# Patient Record
Sex: Male | Born: 1940 | Race: White | Hispanic: No | Marital: Married | Smoking: Never smoker
Health system: Southern US, Community
[De-identification: ages and names within clinical notes are randomized; demographics above are authoritative.]

## PROBLEM LIST (undated history)

## (undated) DIAGNOSIS — M069 Rheumatoid arthritis, unspecified: Secondary | ICD-10-CM

## (undated) DIAGNOSIS — E785 Hyperlipidemia, unspecified: Secondary | ICD-10-CM

## (undated) DIAGNOSIS — K922 Gastrointestinal hemorrhage, unspecified: Secondary | ICD-10-CM

## (undated) DIAGNOSIS — I10 Essential (primary) hypertension: Secondary | ICD-10-CM

## (undated) DIAGNOSIS — Z9289 Personal history of other medical treatment: Secondary | ICD-10-CM

## (undated) DIAGNOSIS — Z8739 Personal history of other diseases of the musculoskeletal system and connective tissue: Secondary | ICD-10-CM

## (undated) DIAGNOSIS — I2510 Atherosclerotic heart disease of native coronary artery without angina pectoris: Secondary | ICD-10-CM

## (undated) DIAGNOSIS — R1310 Dysphagia, unspecified: Secondary | ICD-10-CM

## (undated) DIAGNOSIS — K219 Gastro-esophageal reflux disease without esophagitis: Secondary | ICD-10-CM

## (undated) DIAGNOSIS — T4145XA Adverse effect of unspecified anesthetic, initial encounter: Secondary | ICD-10-CM

## (undated) DIAGNOSIS — I214 Non-ST elevation (NSTEMI) myocardial infarction: Secondary | ICD-10-CM

## (undated) DIAGNOSIS — T8859XA Other complications of anesthesia, initial encounter: Secondary | ICD-10-CM

## (undated) DIAGNOSIS — I480 Paroxysmal atrial fibrillation: Secondary | ICD-10-CM

## (undated) HISTORY — DX: Atherosclerotic heart disease of native coronary artery without angina pectoris: I25.10

## (undated) HISTORY — DX: Essential (primary) hypertension: I10

## (undated) HISTORY — PX: TONSILLECTOMY: SUR1361

## (undated) HISTORY — DX: Hyperlipidemia, unspecified: E78.5

---

## 2002-01-20 ENCOUNTER — Encounter: Admission: RE | Admit: 2002-01-20 | Discharge: 2002-01-20 | Payer: Self-pay | Admitting: Cardiology

## 2002-01-20 ENCOUNTER — Encounter: Payer: Self-pay | Admitting: Cardiology

## 2002-01-23 ENCOUNTER — Inpatient Hospital Stay (HOSPITAL_COMMUNITY): Admission: AD | Admit: 2002-01-23 | Discharge: 2002-01-28 | Payer: Self-pay | Admitting: Cardiology

## 2002-01-23 ENCOUNTER — Encounter: Payer: Self-pay | Admitting: Cardiothoracic Surgery

## 2002-01-24 ENCOUNTER — Encounter: Payer: Self-pay | Admitting: Cardiothoracic Surgery

## 2002-01-24 HISTORY — PX: CORONARY ARTERY BYPASS GRAFT: SHX141

## 2002-01-25 ENCOUNTER — Encounter: Payer: Self-pay | Admitting: Cardiothoracic Surgery

## 2002-01-26 ENCOUNTER — Encounter: Payer: Self-pay | Admitting: Cardiothoracic Surgery

## 2002-01-27 ENCOUNTER — Encounter: Payer: Self-pay | Admitting: Cardiothoracic Surgery

## 2002-02-24 ENCOUNTER — Encounter: Admission: RE | Admit: 2002-02-24 | Discharge: 2002-02-24 | Payer: Self-pay | Admitting: Cardiothoracic Surgery

## 2002-02-24 ENCOUNTER — Encounter: Payer: Self-pay | Admitting: Cardiothoracic Surgery

## 2010-06-26 DIAGNOSIS — Z9289 Personal history of other medical treatment: Secondary | ICD-10-CM

## 2010-06-26 HISTORY — DX: Personal history of other medical treatment: Z92.89

## 2010-07-26 DIAGNOSIS — K922 Gastrointestinal hemorrhage, unspecified: Secondary | ICD-10-CM

## 2010-07-26 HISTORY — DX: Gastrointestinal hemorrhage, unspecified: K92.2

## 2010-10-26 HISTORY — PX: CARDIAC CATHETERIZATION: SHX172

## 2011-06-27 DIAGNOSIS — I214 Non-ST elevation (NSTEMI) myocardial infarction: Secondary | ICD-10-CM

## 2011-06-27 HISTORY — DX: Non-ST elevation (NSTEMI) myocardial infarction: I21.4

## 2011-07-20 ENCOUNTER — Inpatient Hospital Stay (HOSPITAL_COMMUNITY)
Admission: EM | Admit: 2011-07-20 | Discharge: 2011-07-23 | DRG: 281 | Disposition: A | Discharge status: Home / Self Care | Payer: Medicare Other | Source: Ambulatory Visit | Attending: Cardiology | Admitting: Cardiology

## 2011-07-20 ENCOUNTER — Emergency Department (HOSPITAL_COMMUNITY): Payer: Medicare Other

## 2011-07-20 DIAGNOSIS — I25810 Atherosclerosis of coronary artery bypass graft(s) without angina pectoris: Secondary | ICD-10-CM | POA: Diagnosis present

## 2011-07-20 DIAGNOSIS — K279 Peptic ulcer, site unspecified, unspecified as acute or chronic, without hemorrhage or perforation: Secondary | ICD-10-CM | POA: Diagnosis present

## 2011-07-20 DIAGNOSIS — Z7982 Long term (current) use of aspirin: Secondary | ICD-10-CM

## 2011-07-20 DIAGNOSIS — I452 Bifascicular block: Secondary | ICD-10-CM | POA: Diagnosis present

## 2011-07-20 DIAGNOSIS — I214 Non-ST elevation (NSTEMI) myocardial infarction: Principal | ICD-10-CM | POA: Diagnosis present

## 2011-07-20 DIAGNOSIS — I2582 Chronic total occlusion of coronary artery: Secondary | ICD-10-CM | POA: Diagnosis present

## 2011-07-20 DIAGNOSIS — I498 Other specified cardiac arrhythmias: Secondary | ICD-10-CM | POA: Diagnosis present

## 2011-07-20 DIAGNOSIS — I2510 Atherosclerotic heart disease of native coronary artery without angina pectoris: Secondary | ICD-10-CM | POA: Diagnosis present

## 2011-07-20 DIAGNOSIS — E785 Hyperlipidemia, unspecified: Secondary | ICD-10-CM | POA: Diagnosis present

## 2011-07-20 LAB — APTT: aPTT: 22 seconds — ABNORMAL LOW (ref 24–37)

## 2011-07-20 LAB — DIFFERENTIAL
Eosinophils Relative: 1 % (ref 0–5)
Lymphocytes Relative: 10 % — ABNORMAL LOW (ref 12–46)
Lymphs Abs: 1.3 10*3/uL (ref 0.7–4.0)
Monocytes Relative: 8 % (ref 3–12)
Neutrophils Relative %: 81 % — ABNORMAL HIGH (ref 43–77)

## 2011-07-20 LAB — COMPREHENSIVE METABOLIC PANEL
ALT: 25 U/L (ref 0–53)
Albumin: 3.9 g/dL (ref 3.5–5.2)
Alkaline Phosphatase: 72 U/L (ref 39–117)
BUN: 15 mg/dL (ref 6–23)
Potassium: 4.4 mEq/L (ref 3.5–5.1)
Sodium: 143 mEq/L (ref 135–145)
Total Protein: 6.9 g/dL (ref 6.0–8.3)

## 2011-07-20 LAB — CBC
HCT: 45.3 % (ref 39.0–52.0)
MCV: 84.8 fL (ref 78.0–100.0)
RBC: 5.34 MIL/uL (ref 4.22–5.81)
WBC: 13.1 10*3/uL — ABNORMAL HIGH (ref 4.0–10.5)

## 2011-07-20 LAB — POCT I-STAT, CHEM 8
BUN: 17 mg/dL (ref 6–23)
Chloride: 105 mEq/L (ref 96–112)
Sodium: 144 mEq/L (ref 135–145)

## 2011-07-20 LAB — POCT I-STAT TROPONIN I: Troponin i, poc: 0.14 ng/mL (ref 0.00–0.08)

## 2011-07-20 LAB — CK TOTAL AND CKMB (NOT AT ARMC)
CK, MB: 4.7 ng/mL — ABNORMAL HIGH (ref 0.3–4.0)
Relative Index: 3.6 — ABNORMAL HIGH (ref 0.0–2.5)

## 2011-07-20 LAB — HEMOGLOBIN A1C: Hgb A1c MFr Bld: 5.6 % (ref <5.7)

## 2011-07-20 LAB — MAGNESIUM: Magnesium: 2.2 mg/dL (ref 1.5–2.5)

## 2011-07-21 LAB — URINALYSIS, ROUTINE W REFLEX MICROSCOPIC
Bilirubin Urine: NEGATIVE
Glucose, UA: NEGATIVE mg/dL
Hgb urine dipstick: NEGATIVE
Ketones, ur: NEGATIVE mg/dL
pH: 6.5 (ref 5.0–8.0)

## 2011-07-21 LAB — LIPID PANEL
Cholesterol: 135 mg/dL (ref 0–200)
HDL: 39 mg/dL — ABNORMAL LOW (ref >39)
LDL Cholesterol: 79 mg/dL (ref 0–99)
Triglycerides: 85 mg/dL (ref <150)

## 2011-07-21 LAB — CK TOTAL AND CKMB (NOT AT ARMC)
Relative Index: 6.9 — ABNORMAL HIGH (ref 0.0–2.5)
Relative Index: 8.1 — ABNORMAL HIGH (ref 0.0–2.5)

## 2011-07-21 LAB — TROPONIN I
Troponin I: 0.64 ng/mL (ref <0.30)
Troponin I: 2.04 ng/mL (ref <0.30)

## 2011-07-21 LAB — CBC
MCH: 28.9 pg (ref 26.0–34.0)
MCV: 84.8 fL (ref 78.0–100.0)
Platelets: 181 10*3/uL (ref 150–400)
RBC: 4.99 MIL/uL (ref 4.22–5.81)

## 2011-07-21 LAB — POCT ACTIVATED CLOTTING TIME: Activated Clotting Time: 105 seconds

## 2011-07-21 LAB — CARDIAC PANEL(CRET KIN+CKTOT+MB+TROPI)
CK, MB: 16.1 ng/mL (ref 0.3–4.0)
Total CK: 244 U/L — ABNORMAL HIGH (ref 7–232)

## 2011-07-22 LAB — CBC
MCH: 28.5 pg (ref 26.0–34.0)
MCV: 84.8 fL (ref 78.0–100.0)
Platelets: 177 10*3/uL (ref 150–400)
RDW: 13.9 % (ref 11.5–15.5)

## 2011-07-22 LAB — BASIC METABOLIC PANEL
BUN: 15 mg/dL (ref 6–23)
CO2: 26 mEq/L (ref 19–32)
Chloride: 107 mEq/L (ref 96–112)
Creatinine, Ser: 0.93 mg/dL (ref 0.50–1.35)
GFR calc Af Amer: >60 mL/min (ref >60)
Potassium: 3.9 mEq/L (ref 3.5–5.1)

## 2011-07-28 NOTE — Discharge Summary (Signed)
Earl Keller, Earl Keller                 ACCOUNT NO.:  [id number]  MEDICAL RECORD NO.:  [id number]  LOCATION:  3712                         FACILITY:  [facility]  PHYSICIAN:  Thereasa Solo. Little, M.D. DATE OF BIRTH:  [birth year]  DATE OF ADMISSION:  07/20/2011 DATE OF DISCHARGE:  07/23/2011                              DISCHARGE SUMMARY   DISCHARGE DIAGNOSES: 1. Non-ST-elevation myocardial infarction with a peak CK of 244 and 16     MB and a troponin of 2.35. 2. Known coronary disease with coronary artery bypass grafting x5 in     2003, catheterization this admission, plan is for medical therapy. 3. Mild left ventricular dysfunction with an EF of 40%-45%     catheterization. 4. Treated dyslipidemia with an LDL of 79. 5. History of gastrointestinal bleeding, October 2011, his aspirin was     resumed this admission and he is tolerating this, but will need to     continue on a PPI.  HOSPITAL COURSE:  The patient is a 70 year old male who had bypass grafting x5 in 2003 by Dr. Tyrone Sage.  He had done well since then.  On the day of admission, he was playing golf with his wife when he had chest pain and diaphoresis.  He admitted to her that he had actually had a couple episodes of chest pain similar to that in the last week.  He presented to [facility] where his EKG showed ST elevation in V3 and he was treated as a STEMI.  He was transferred to [facility] and evaluated in the emergency room by Dr. Tresa Endo and myself.  We did not feel he was an ST-elevation MI.  He was admitted with unstable angina.  His enzymes were obtained and were positive for a non-ST-elevation MI.  He was cathed on July 21, 2011 by Dr. Tresa Endo, he had a patent LIMA to the LAD, patent sequential vein graft to the ramus intermedius and the OM, but there was an 80% distal OM narrowing, the SVG to RCA was patent with a 50%-60% narrowing at the insertion and 80% small PDA lesion after the graft insertion.  EF was 40%-45%, renal  arteries and iliacs were normal. Plan is for continued medical therapy.  He has been pain-free, ambulated.  We feel he can be discharged on July 23, 2011.  He will follow up with Dr. Clarene Duke.  Please see med rec for complete discharge medications.  LABORATORY DATA:  As noted.  CK is peaked at 244 with 16 MB and troponin of 2.35.  Urinalysis is unremarkable.  Cholesterol 135, HDL 39, LDL 79. Hemoglobin A1c is 5.6.  Liver functions were normal.  Sodium 141, potassium 3.9, BUN 15, creatinine 0.9.  White count 9.5, hemoglobin 13.7, hematocrit 40.8, platelets 177.  His EKG shows sinus rhythm, right bundle-branch block, and left anterior fascicular block.  DISPOSITION:  The patient is discharged in stable condition.  We will decrease his beta-blocker at discharge because of his bifascicular block and bradycardia.     Abelino Derrick, P.A.   ______________________________ Thereasa Solo. Little, M.D.    Earl Keller  D:  07/23/2011  T:  07/23/2011  Job:  [id number]  Electronically  Signed by Corine Shelter P.A. on 07/25/2011 06:07:42 PM Electronically Signed by Julieanne Manson M.D. on 07/28/2011 11:29:28 AM

## 2011-07-28 NOTE — H&P (Signed)
Earl Keller, Earl Keller                 ACCOUNT NO.:  [id number]  MEDICAL RECORD NO.:  [id number]  LOCATION:  3712                         FACILITY:  [facility]  PHYSICIAN:  Thereasa Solo. Little, M.D. DATE OF BIRTH:  03/18/1941  DATE OF ADMISSION:  07/20/2011 DATE OF DISCHARGE:                             HISTORY & PHYSICAL   CHIEF COMPLAINTS:  Chest pain.  HISTORY OF PRESENT ILLNESS:  Mr. Kyler is a 70 year old male followed by Dr. Clarene Duke with a history of coronary artery bypass grafting in 2003. This was by Dr. Tyrone Sage.  He had an LIMA to the LAD, an SVG to the diagonal, an SVG to the ramus intermedius, and circumflex sequentially and an SVG to the PDA.  We last saw him in 2008.  He has done well, he is a nonsmoker and daily exerciser.  In October 2011, he did have some GI bleeding and his aspirin was stopped.  Today, while playing golf with his wife, he noted substernal chest pain which was associated with diaphoresis.  He admitted that he had had previous episodes in the past 2 weeks of similar symptoms.  He has not taken nitroglycerin.  He presented to [facility] and was felt to have ST elevation in lead V3 and was treated as an ST elevation MI.  He was transferred urgently to [facility] where he was evaluated in the emergency room by Dr. Tresa Endo and myself.  He is currently pain-free.  His EKG does not show any ST elevation now.  PAST MEDICAL HISTORY:  Remarkable for peptic ulcer disease, he is taken off aspirin on October 2011, after GI bleed.  He has treated dyslipidemia.  HOME MEDICATIONS:  Omeprazole 40 mg a day and Lipitor 40 mg a day.  ALLERGIES:  He has no known drug allergies.  SOCIAL HISTORY:  He is a retired Engineer, site.  He is a nonsmoker.  He is married.  He has 3 children.  FAMILY HISTORY:  Remarkable for coronary artery disease.  REVIEW OF SYSTEMS:  He has had no recent melena or GI bleeding.  PHYSICAL EXAMINATION:  VITAL SIGNS:  Blood pressure 118/70, pulse  60, temperature is afebrile, respirations 12. GENERAL:  He is a well-developed, well-nourished male in no acute distress. HEENT:  Normocephalic.  Extraocular movements are intact.  Sclera is nonicteric.  Lids and conjunctivae within normal limits. NECK:  Without JVD or bruit. CHEST:  Clear to auscultation and percussion. CARDIAC:  Regular rate and rhythm without murmur, rub or gallop.  Normal S1 and S2. ABDOMEN:  Nontender, nondistended. EXTREMITIES:  No edema.  Distal pulses are 3+/4.  There are no bruits. NEURO:  Grossly intact.  He is awake, alert, oriented, and cooperative. Moves all extremities without obvious deficit.  EKG shows sinus rhythm with an incomplete right bundle branch block. Labs are pending.  IMPRESSION: 1. Unstable angina, rule out myocardial infarction. 2. Coronary artery disease with coronary artery bypass grafting x5 in     2003. 3. Peptic ulcer disease October 2011, aspirin was discontinued at that     time. 4. Treated dyslipidemia.  PLAN:  The patient was seen by Dr. Tresa Endo and myself in the emergency room.  He will be started on IV heparin, nitroglycerin and admitted to the intensive care unit.  We will add beta-blocker and PPI and resume his aspirin.     Abelino Derrick, P.A.   ______________________________ Thereasa Solo. Little, M.D.    Lenard Lance  D:  07/23/2011  T:  07/23/2011  Job:  [id number]  cc:   Desmond Dike, MD  Electronically Signed by Corine Shelter P.A. on 07/25/2011 06:07:38 PM Electronically Signed by Julieanne Manson M.D. on 07/28/2011 11:29:10 AM

## 2011-07-29 NOTE — Cardiovascular Report (Signed)
NAMEDANNE, VASEK NO.:  [id number]  MEDICAL RECORD NO.:  [id number]  LOCATION:                                 FACILITY:  PHYSICIAN:  Nicki Guadalajara, M.D.     DATE OF BIRTH:  Mar 23, 1941  DATE OF PROCEDURE: DATE OF DISCHARGE:                           CARDIAC CATHETERIZATION   PROCEDURE:  Left heart catheterization:  Cine coronary angiography; selective angiography into vein grafts, selective angiography into left internal mammary artery; biplane cine left ventriculography; distal aortography.  INDICATIONS:  [name]. Earl Keller is a 70 year old patient of Dr. Julieanne Manson who underwent CABG revascularization surgery x5 in 2003 by Dr. Tyrone Sage and had a LIMA placed to the LAD, a vein to a diagonal, sequential vein to the ramus intermediate and circumflex vessel, and a vein to the PDA.  The patient apparently has a history of peptic ulcer disease with GI bleed in October 2011 and at that time his aspirin was discontinued.  He has a history of dyslipidemia.  For the past 1-2 weeks, he has noticed some recurrent episodes of chest discomfort. Yesterday, while playing golf, he developed more chest pain.  He ultimately presented to [facility] ER.  His initial ECG showed T-wave inversion in V2 and possible 1-mm J-point elevation in lead III.  A code STEMI was called from Sage.  The patient was treated with heparin and nitroglycerin.  Upon arrival to [facility] ER, he was completely pain free with resolution of ECG changes.  The code STEMI was called off.  He was admitted to the Coronary Care Unit where he remained pain free.  CK was 131 with an MB of 4.7, repeat 157 with an MB of 10.8.  Initial troponin was less than 0.30, repeat 0.64.  He now presents for cardiac catheterization.  PROCEDURE:  After premedication with Versed 2 mg plus fentanyl 25 mcg, the patient was prepped and draped in the usual fashion.  His right femoral artery was punctured anteriorly and a  5-French sheath was inserted without difficulty.  Diagnostic cardiac catheterization was done utilizing 5-French Judkins #4 left and right coronary catheters. The right catheter was also used for selective angiography into the saphenous vein grafts.  A left internal mammary artery catheter was used for selective angiography into the left internal mammary artery.  A 5- French pigtail catheter was then inserted and utilized for biplane cine left ventriculography as well as distal aortography.  Hemostasis was obtained by direct manual pressure.  The patient tolerated the procedure well.  HEMODYNAMIC DATA:  Central aortic pressure was 120/60.  Left ventricular pressure 120/14.  ANGIOGRAPHIC DATA:  Left main coronary artery was angiographically normal and bifurcated into an LAD and left circumflex system.  The LAD gave rise to a proximal diagonal vessel that appeared free of significant disease.  There was segmental 80% stenosis in the LAD after this first diagonal vessel proximal to a septal perforating artery and then the LAD had a flush and fill phenomena due to competitive LIMA filling.  The circumflex coronary artery was essentially subtotally occluded at its origin with greater than 99% stenosis with insignificant filling.  The right coronary artery had proximal 95%  stenosis and then was totally occluded in its mid portion after a small anterior RV marginal branch.  The sequential vein graft which supplied the ramus intermediate vessel and distal circumflex marginal vessel had smooth 30% taper ostially and proximally.  The vessel supplied the ramus intermediate vessel and then the sequential limb supplied the distal circumflex marginal vessel.  In the mid distal portion of this marginal vessel, there was diffuse narrowing of at least 80% and a very small-caliber vessel.  The vein graft which supplied the diagonal vessel was totally occluded at its origin.  Next, a saphenous vein  graft supplying the PDA was large- caliber graft.  Right at the anastomosis, there was smooth 50-60% narrowing extending into a branch of this PDA.  In the very distal PDA limb, there was diffuse 80% stenosis.  Antegrade to the graft prior to the takeoff of the PLA vessel, there was 95% proximal PDA stenosis which also jeopardized the PLA segment.  There was segmental 95% stenosis in the RCA antegrade diffusely in the region of the acute margin extending to the total occluded mid segment.  Next, the left internal mammary artery had ostial smooth taper of approximately 50% and anastomosed into the mid LAD and otherwise was free of significant disease with the exception of the smooth ostial taper.  The LAD extended to the LV apex.  Biplane cine left ventriculography revealed an ejection fraction of approximately 40-45%.  On the LAO projection, there was a moderate focal hypocontractility in the mid distal anterolateral wall with a more severe hypo to akinesis very focally in the mid anterolateral segment. On the LAO projection, there was mild distal inferolateral hypocontractility.  Distal aortography revealed widely patent renal arteries without significant aortoiliac disease.  IMPRESSION: 1. Mild moderate left ventricular dysfunction with an ejection     fraction of 40-45% with moderate focal hypocontractility in the mid     distal anterolateral wall and very focal severe hypocontractility     in the mid anterolateral segment as well as mild hypocontractility     in the distal inferolateral segment. 2. Significant native coronary artery disease with segmental proximal     80% stenosis in the left anterior descending after the first     diagonal vessel before the septal perforating artery. 3. Supple total occlusion of the circumflex vessel ostially. 4. 95% very proximal and 100% occlusion of the mid right coronary     artery. 5. Patent left internal mammary artery graft supplying  the mid left     anterior descending, but evidence for smooth 50% ostial narrowing     arising from the subclavian artery. 6. Sequential vein graft supplying the ramus intermediate vessel and     distal circumflex marginal vessel with smooth 30% ostial proximal     narrowing in the graft and evidence for distal obtuse marginal     diffuse narrowing of 80% in the small caliber portion of the     vessel. 7. Large vein graft supplying the posterior descending artery of the     right coronary artery with smooth 50-60% anastomosis narrowing and     very distal 80% stenosis in the posterior descending artery with     antegrade proximal 95% posterior descending artery stenosis     jeopardizing posterolateral artery vessel.  RECOMMENDATIONS:  [name] will be initially treated with increased medical therapy.  Consideration for Ranexa will be added to his regimen. Subsequent nuclear imaging may be helpful to ascertain areas of ischemia  on increased medical therapy.          ______________________________ Nicki Guadalajara, M.D.     TK/MEDQ  D:  07/21/2011  T:  07/21/2011  Job:  [id number]  cc:   Thereasa Solo. Little, M.D. Desmond Dike, MD  Electronically Signed by Nicki Guadalajara M.D. on 07/29/2011 01:05:41 PM

## 2012-11-28 IMAGING — CR DG CHEST 1V PORT
1 series · 1 of 1 positions shown · non-contrast
Comparison: None.

CLINICAL DATA: Severe left-sided chest pain

PORTABLE CHEST - 1 VIEW

[AP]
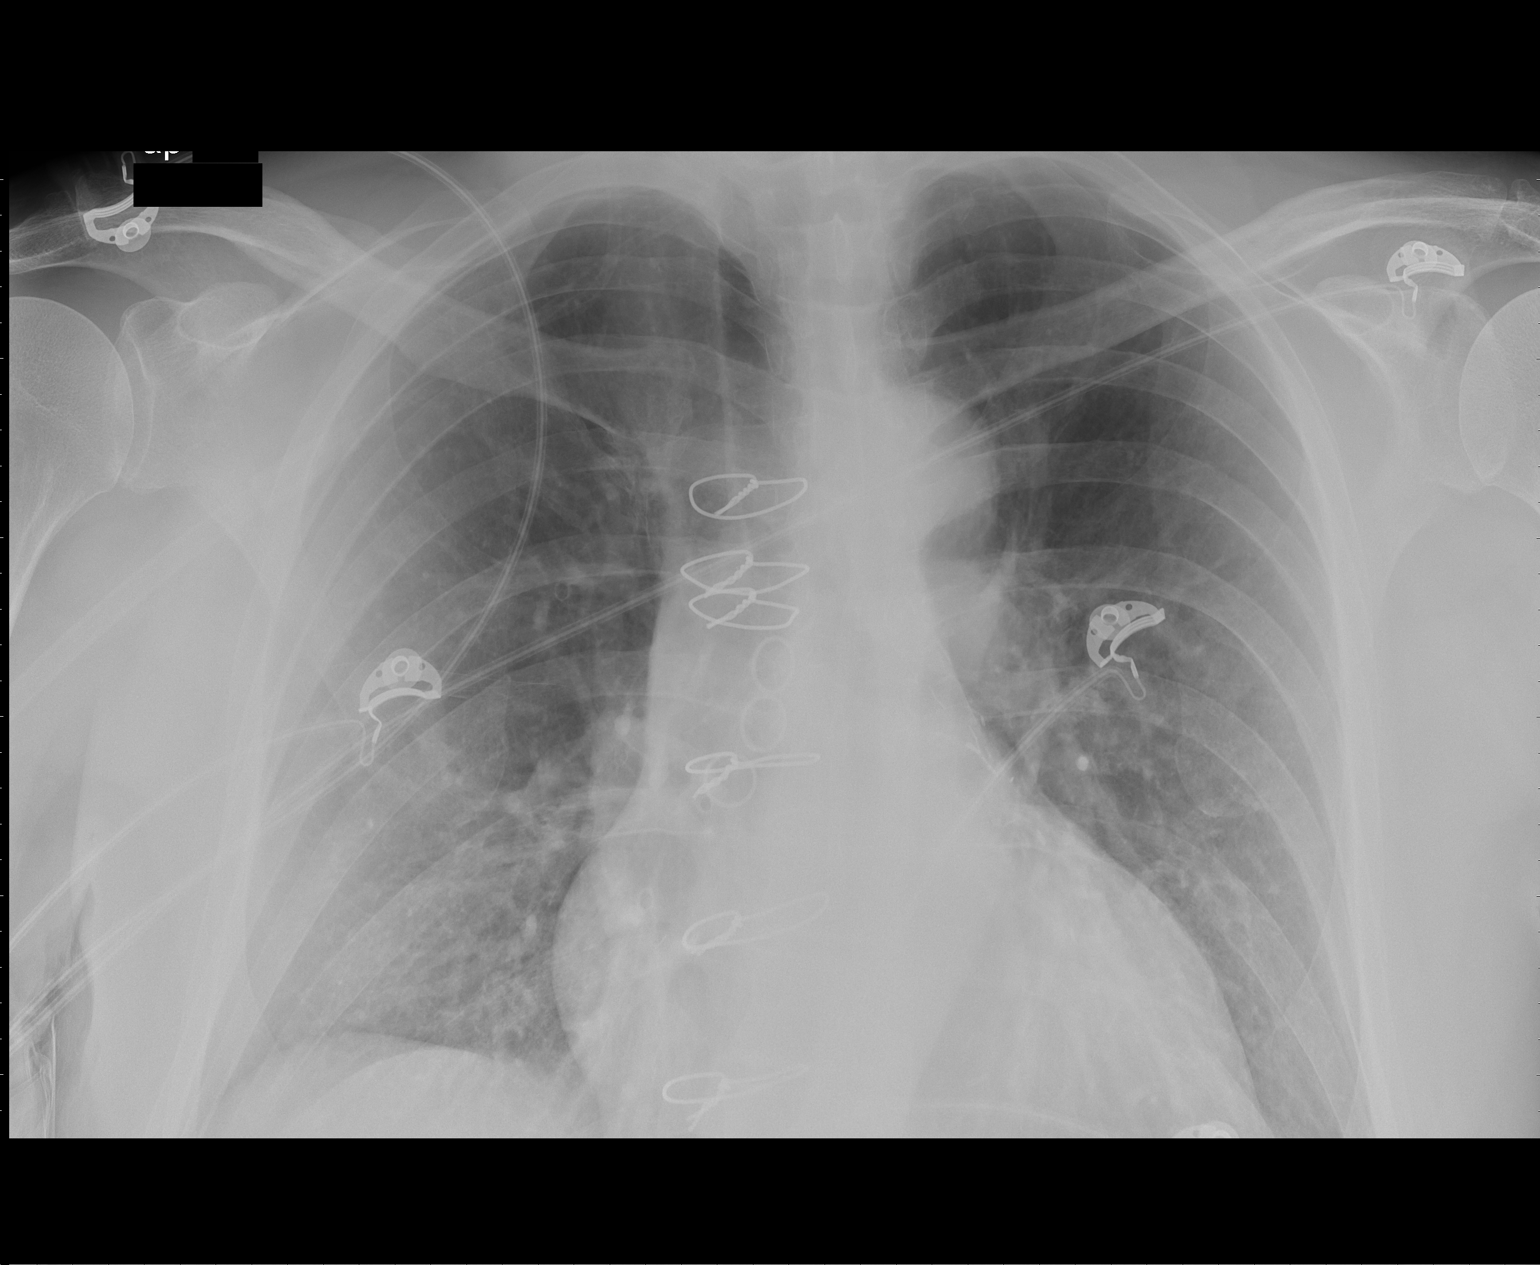

[1 of 1 positions shown; findings below may reference images not displayed]

FINDINGS: Chronic interstitial markings in the bilateral lower
lobes.  No focal consolidation. No pleural effusion or
pneumothorax.

Mild cardiomegaly. Postsurgical changes related to prior CABG.
IMPRESSION: No evidence of acute cardiopulmonary disease.

Mild cardiomegaly.

## 2013-05-31 ENCOUNTER — Other Ambulatory Visit: Payer: Self-pay | Admitting: Cardiovascular Disease

## 2013-05-31 NOTE — Telephone Encounter (Signed)
Rx was sent to pharmacy electronically. 

## 2013-06-06 ENCOUNTER — Telehealth: Payer: Self-pay

## 2013-06-07 ENCOUNTER — Encounter: Payer: Self-pay | Admitting: Internal Medicine

## 2013-06-07 ENCOUNTER — Encounter: Payer: Self-pay | Admitting: *Deleted

## 2013-06-08 ENCOUNTER — Ambulatory Visit (INDEPENDENT_AMBULATORY_CARE_PROVIDER_SITE_OTHER): Payer: Medicare PPO | Admitting: Physician Assistant

## 2013-06-08 ENCOUNTER — Encounter: Payer: Self-pay | Admitting: Physician Assistant

## 2013-06-08 VITALS — BP 128/62 | HR 58 | Ht 74.0 in | Wt 209.5 lb

## 2013-06-08 DIAGNOSIS — I255 Ischemic cardiomyopathy: Secondary | ICD-10-CM

## 2013-06-08 DIAGNOSIS — I2589 Other forms of chronic ischemic heart disease: Secondary | ICD-10-CM

## 2013-06-08 DIAGNOSIS — I2510 Atherosclerotic heart disease of native coronary artery without angina pectoris: Secondary | ICD-10-CM

## 2013-06-08 DIAGNOSIS — Z951 Presence of aortocoronary bypass graft: Secondary | ICD-10-CM

## 2013-06-08 NOTE — Assessment & Plan Note (Signed)
Patient is a very active male currently doing well with no complaints. We did discuss stopping the Ranexa as he is only taking one dose daily. This was a discussion that Dr. Rennis Golden had with him previously as well.  If he develops angina off of the medication, he will restart it.  As active as he appears to be, I do not think this will occur. He will followup with Dr. Rennis Golden 6 months

## 2013-06-08 NOTE — Progress Notes (Signed)
Date:  06/08/2013   ID:  Earl Keller, DOB June 14, 1941, MRN [id number]  PCP:  Pcp Not In System  Primary Cardiologist:  HIlty     History of Present Illness: Earl Keller is a 72 y.o. male with a history of coronary artery bypass grafting in 2003. In 2012 and non-ST elevation myocardial infarction and catheterization at that time showed patent grafts with small vessel disease and distal disease of native vessels. The ejection fraction that time was 45%. The patient has been doing well on medical therapy with no complaints of angina. He has been taking Ranexa once Earl since about February.  He states he is quite active does some hiking and biking fairly regularly without any concerns.  He currently denies nausea, vomiting, fever, chest pain, shortness of breath, orthopnea, dizziness, PND, cough, congestion, abdominal pain, hematochezia, melena, lower extremity edema, claudication.  Wt Readings from Last 3 Encounters:  06/08/13 209 lb 8 oz (95.029 kg)     Past Medical History  Diagnosis Date  . CAD (coronary artery disease)     post bypass in 2003  . Dyslipidemia   . Hypertension     Current Outpatient Prescriptions  Medication Sig Dispense Refill  . aspirin 81 MG tablet Take 81 mg by mouth Earl.      Marland Kitchen atorvastatin (LIPITOR) 40 MG tablet Take 40 mg by mouth Earl.      . isosorbide mononitrate (IMDUR) 60 MG 24 hr tablet Take 60 mg by mouth Earl.      . metoprolol tartrate (LOPRESSOR) 25 MG tablet TAKE ONE-HALF TABLET BY MOUTH TWICE Earl  30 tablet  6  . nitroGLYCERIN (NITROSTAT) 0.4 MG SL tablet Place 0.4 mg under the tongue every 5 (five) minutes as needed for chest pain.      Marland Kitchen omeprazole (PRILOSEC) 20 MG capsule Take 20 mg by mouth Earl.      . ranolazine (RANEXA) 500 MG 12 hr tablet Take 500 mg by mouth Earl.        No current facility-administered medications for this visit.    Allergies:   No Known Allergies  Social History:  The patient  reports that he has  never smoked. He has never used smokeless tobacco. He reports that he does not drink alcohol or use illicit drugs.   Family history:   Family History  Problem Relation Age of Onset  . Sudden death Mother 52  . Liver disease Maternal Grandmother   . Anuerysm Maternal Grandfather   . Stroke Paternal Grandmother   . Heart attack Paternal Grandfather     ROS:  Please see the history of present illness.  All other systems reviewed and negative.   PHYSICAL EXAM: VS:  BP 128/62  Pulse 58  Ht 6\' 2"  (1.88 m)  Wt 209 lb 8 oz (95.029 kg)  BMI 26.89 kg/m2 Well nourished, well developed, in no acute distress HEENT: Pupils are equal round react to light accommodation extraocular movements are intact.  Neck: no JVDNo cervical lymphadenopathy. Cardiac: Regular rate and rhythm without murmurs rubs or gallops. Lungs:  clear to auscultation bilaterally, no wheezing, rhonchi or rales Abd: soft, nontender, positive bowel sounds all quadrants, no hepatosplenomegaly Ext: no lower extremity edema.  2+ radial and dorsalis pedis pulses. Skin: warm and dry Neuro:  Grossly normal  EKG:  Sinus bradycardia(58) first degree AV block right bundle branch block    ASSESSMENT AND PLAN:  Problem List Items Addressed This Visit   Status post coronary  artery bypass grafting, 2003.     Coronary artery disease     Patient is a very active male currently doing well with no complaints. We did discuss stopping the Ranexa as he is only taking one dose Earl. This was a discussion that Dr. Rennis Golden had with him previously as well.  If he develops angina off of the medication, he will restart it.  As active as he appears to be, I do not think this will occur. He will followup with Dr. Rennis Golden 6 months    Cardiomyopathy, ischemic.  Ejection fraction 45% by LV gram during cath 07/21/2011    Other Visit Diagnoses   CAD (coronary artery disease)    -  Primary    Relevant Orders       EKG 12-Lead

## 2013-06-08 NOTE — Patient Instructions (Addendum)
Followup with Dr. Hilty in 6 months 

## 2013-07-04 ENCOUNTER — Other Ambulatory Visit: Payer: Self-pay | Admitting: Cardiovascular Disease

## 2013-07-04 NOTE — Telephone Encounter (Signed)
Rx was sent to pharmacy electronically. 

## 2013-12-12 ENCOUNTER — Ambulatory Visit: Payer: Medicare PPO | Admitting: Internal Medicine

## 2014-01-11 ENCOUNTER — Ambulatory Visit (INDEPENDENT_AMBULATORY_CARE_PROVIDER_SITE_OTHER): Payer: Medicare PPO | Admitting: Internal Medicine

## 2014-01-11 ENCOUNTER — Encounter: Payer: Self-pay | Admitting: Internal Medicine

## 2014-01-11 VITALS — BP 112/68 | HR 53 | Ht 74.0 in | Wt 209.6 lb

## 2014-01-11 DIAGNOSIS — I255 Ischemic cardiomyopathy: Secondary | ICD-10-CM

## 2014-01-11 DIAGNOSIS — K219 Gastro-esophageal reflux disease without esophagitis: Secondary | ICD-10-CM

## 2014-01-11 DIAGNOSIS — E785 Hyperlipidemia, unspecified: Secondary | ICD-10-CM | POA: Insufficient documentation

## 2014-01-11 DIAGNOSIS — I2589 Other forms of chronic ischemic heart disease: Secondary | ICD-10-CM

## 2014-01-11 DIAGNOSIS — Z951 Presence of aortocoronary bypass graft: Secondary | ICD-10-CM

## 2014-01-11 DIAGNOSIS — I2510 Atherosclerotic heart disease of native coronary artery without angina pectoris: Secondary | ICD-10-CM

## 2014-01-11 NOTE — Patient Instructions (Signed)
Your physician recommends that you return for lab work at your earliest convenience. You will need to be fasting (nothing to eat/drink after midnight)  Your physician wants you to follow-up in: 1 year. You will receive a reminder letter in the mail two months in advance. If you don't receive a letter, please call our office to schedule the follow-up appointment.

## 2014-01-11 NOTE — Progress Notes (Signed)
OFFICE NOTE  Chief Complaint:  No complaints  Primary Care Physician: Desmond Dike, MD  HPI:  Earl Keller is a 73 y.o. male with a history of coronary artery bypass grafting in 2003. In 2012 and non-ST elevation myocardial infarction and catheterization at that time showed patent grafts with small vessel disease and distal disease of native vessels. The ejection fraction that time was 45%. The patient has been doing well on medical therapy with no complaints of angina. He states he is quite active does some hiking and biking fairly regularly without any concerns. He currently denies nausea, vomiting, fever, chest pain, shortness of breath, orthopnea, dizziness, PND, cough, congestion, abdominal pain, hematochezia, melena, lower extremity edema, claudication.  He was previously on Ranexa and isosorbide.   PMHx:  Past Medical History  Diagnosis Date  . CAD (coronary artery disease)     post bypass in 2003  . Dyslipidemia   . Hypertension     Past Surgical History  Procedure Laterality Date  . Coronary artery bypass graft  01/2002    EF 45%  . Cardiac catheterization  2012    showed patent grafts with small- vessel disease and distal disease of the native vessels. 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 Sudden death Mother 42  . Liver disease Maternal Grandmother   . Anuerysm Maternal Grandfather   . Stroke Paternal Grandmother   . Heart attack Paternal Grandfather     SOCHx:   reports that he has never smoked. He has never used smokeless tobacco. He reports that he does not drink alcohol or use illicit drugs.  ALLERGIES:  No Known Allergies  ROS: A comprehensive review of systems was negative.  HOME  MEDS: Current Outpatient Prescriptions  Medication Sig Dispense Refill  . aspirin 81 MG tablet Take 81 mg by mouth daily.      Marland Kitchen atorvastatin (LIPITOR) 40 MG tablet Take 40 mg by mouth daily.      . metoprolol tartrate (LOPRESSOR) 25 MG tablet TAKE ONE-HALF TABLET BY MOUTH TWICE DAILY  30 tablet  6  . nitroGLYCERIN (NITROSTAT) 0.4 MG SL tablet Place 0.4 mg under the tongue every 5 (five) minutes as needed for chest pain.      Marland Kitchen omeprazole (PRILOSEC) 20 MG capsule Take 20 mg by mouth daily.       No current facility-administered medications for this visit.    LABS/IMAGING: No results found for this or any previous visit (from the past 48 hour(s)). No results found.  VITALS: BP 112/68  Pulse 53  Ht 6\' 2"  (1.88 m)  Wt 209 lb 9.6 oz (95.074 kg)  BMI 26.90 kg/m2  EXAM: General appearance: alert and no distress Neck: no carotid bruit and no JVD Lungs: clear to auscultation bilaterally Heart: regular rate and rhythm, S1, S2 normal, no murmur, click, rub or gallop Abdomen: soft, non-tender; bowel sounds normal; no masses,  no organomegaly Extremities: extremities normal, atraumatic, no cyanosis or edema Pulses: 2+ and symmetric Skin: Skin color, texture, turgor normal. No rashes or lesions Neurologic: Grossly normal Psych: Mood, affect normal  EKG: Sinus bradycardia at 53, bifascicular block, 1st degree AVB  ASSESSMENT: 1. CAD s/p CABG x  5 in 2003 2. Dyslipidemia 3. Ischemic cardioymyopathy, EF 45% 4. HTN  PLAN: 1.   Earl Keller is doing well and is asymptomatic. He denies any chest pain despite coming off of Imdur were and Ranexa. His current myopathy appears stable without any worsening symptoms or signs of congestion. His blood pressure is well-controlled. He is due for recheck of his cholesterol and I provided him with a lab slip for that today. Plan to see him back annually or sooner as necessary.  Chrystie Nose, MD, [facility] Attending Cardiologist [facility]  Gee Habig C 01/11/2014, 11:21 AM

## 2014-03-15 ENCOUNTER — Other Ambulatory Visit: Payer: Self-pay

## 2014-03-15 MED ORDER — METOPROLOL TARTRATE 25 MG PO TABS: 12.5000 mg | ORAL_TABLET | Freq: Two times a day (BID) | ORAL | Status: DC

## 2014-03-15 NOTE — Telephone Encounter (Signed)
Rx was sent to pharmacy electronically. 

## 2014-11-26 DIAGNOSIS — Z8739 Personal history of other diseases of the musculoskeletal system and connective tissue: Secondary | ICD-10-CM

## 2014-11-26 HISTORY — DX: Personal history of other diseases of the musculoskeletal system and connective tissue: Z87.39

## 2014-11-27 ENCOUNTER — Telehealth: Payer: Self-pay | Admitting: Internal Medicine

## 2014-11-27 MED ORDER — ISOSORBIDE MONONITRATE ER 60 MG PO TB24: 60.0000 mg | ORAL_TABLET | Freq: Every day | ORAL | Status: DC

## 2014-11-27 NOTE — Telephone Encounter (Signed)
Pt called in wanting to speak with Dr. Rennis Golden about some recommendations for medications that he can take when he has periodic episodes of Angina. Please call  Thanks

## 2014-11-27 NOTE — Telephone Encounter (Signed)
Pt calling to request advice on angina.  Historically had been on Imdur and Ranexa, discontinued both 1+ year ago due to resolution of angina episodes.  Has nitro SL for PRN use.  For past 3 weeks has had recurrent, "mild" chest tightness that typically is brought on w/ exertion and resolves at rest.  He took 1x nitro SL for chest discomfort yesterday evening around dinner, which resolved symptoms.  He then took 1x nitro SL for chest discomfort last night before bed, which again resolved symptoms.  Pt wants to know if Dr. Rennis Golden would recommend putting him back on one of his daily medications for angina.  Has f/u sched. w/ Dr. Rennis Golden 01/11/15

## 2014-11-27 NOTE — Telephone Encounter (Signed)
Yes .. If he is having new angina, he needs to be seen - probably needs another stress test.  Thanks.  Dr. Rexene Edison

## 2014-11-27 NOTE — Telephone Encounter (Signed)
Dr. Allyson Sabal DOD, requested his advice.  Decision to have patient eval'd today by [facility] on flex or moved up to Dr. Blanchie Dessert calendar on 11/29/14, Dr. Allyson Sabal advised OV w/ Hilty.   Regarding meds, Dr. Allyson Sabal advised restarting Imdur at previous dose.  I called pt, communicated this. Pt acknowledged understanding. Confirmed appt for 10:15 11/29/14.

## 2014-11-29 ENCOUNTER — Encounter: Payer: Self-pay | Admitting: Internal Medicine

## 2014-11-29 ENCOUNTER — Ambulatory Visit (INDEPENDENT_AMBULATORY_CARE_PROVIDER_SITE_OTHER): Payer: Medicare PPO | Admitting: Internal Medicine

## 2014-11-29 ENCOUNTER — Ambulatory Visit
Admission: RE | Admit: 2014-11-29 | Discharge: 2014-11-29 | Disposition: A | Discharge status: Home / Self Care | Payer: Medicare PPO | Source: Ambulatory Visit | Attending: Internal Medicine | Admitting: Internal Medicine

## 2014-11-29 ENCOUNTER — Other Ambulatory Visit: Payer: Self-pay | Admitting: *Deleted

## 2014-11-29 VITALS — BP 126/78 | HR 60 | Ht 74.0 in | Wt 212.2 lb

## 2014-11-29 DIAGNOSIS — I200 Unstable angina: Secondary | ICD-10-CM | POA: Insufficient documentation

## 2014-11-29 DIAGNOSIS — R5383 Other fatigue: Secondary | ICD-10-CM

## 2014-11-29 DIAGNOSIS — I2583 Coronary atherosclerosis due to lipid rich plaque: Secondary | ICD-10-CM

## 2014-11-29 DIAGNOSIS — I2510 Atherosclerotic heart disease of native coronary artery without angina pectoris: Secondary | ICD-10-CM

## 2014-11-29 DIAGNOSIS — Z01818 Encounter for other preprocedural examination: Secondary | ICD-10-CM

## 2014-11-29 DIAGNOSIS — D689 Coagulation defect, unspecified: Secondary | ICD-10-CM

## 2014-11-29 DIAGNOSIS — Z79899 Other long term (current) drug therapy: Secondary | ICD-10-CM

## 2014-11-29 DIAGNOSIS — I255 Ischemic cardiomyopathy: Secondary | ICD-10-CM

## 2014-11-29 DIAGNOSIS — Z951 Presence of aortocoronary bypass graft: Secondary | ICD-10-CM

## 2014-11-29 LAB — CBC
HEMATOCRIT: 45.7 % (ref 39.0–52.0)
Hemoglobin: 15.3 g/dL (ref 13.0–17.0)
MCH: 28.8 pg (ref 26.0–34.0)
MCHC: 33.5 g/dL (ref 30.0–36.0)
MCV: 86.1 fL (ref 78.0–100.0)
MPV: 10.3 fL (ref 8.6–12.4)
Platelets: 198 10*3/uL (ref 150–400)
RBC: 5.31 MIL/uL (ref 4.22–5.81)
RDW: 13.8 % (ref 11.5–15.5)
WBC: 7 10*3/uL (ref 4.0–10.5)

## 2014-11-29 LAB — BASIC METABOLIC PANEL
BUN: 15 mg/dL (ref 6–23)
CALCIUM: 9.3 mg/dL (ref 8.4–10.5)
CHLORIDE: 107 meq/L (ref 96–112)
CO2: 28 meq/L (ref 19–32)
Creat: 1.05 mg/dL (ref 0.50–1.35)
Glucose, Bld: 103 mg/dL — ABNORMAL HIGH (ref 70–99)
POTASSIUM: 4.8 meq/L (ref 3.5–5.3)
SODIUM: 141 meq/L (ref 135–145)

## 2014-11-29 LAB — PROTIME-INR
INR: 1.09 (ref <1.50)
PROTHROMBIN TIME: 14.1 s (ref 11.6–15.2)

## 2014-11-29 LAB — APTT: aPTT: 26 seconds (ref 24–37)

## 2014-11-29 MED ORDER — NITROGLYCERIN 0.4 MG SL SUBL: 0.4000 mg | SUBLINGUAL_TABLET | SUBLINGUAL | Status: DC | PRN

## 2014-11-29 NOTE — Patient Instructions (Signed)
Your physician has requested that you have a cardiac catheterization TOMORROW 11/30/2014 with Dr. Excell Seltzer or Dr. Clifton James. Cardiac catheterization is used to diagnose and/or treat various heart conditions. Doctors may recommend this procedure for a number of different reasons. The most common reason is to evaluate chest pain. Chest pain can be a symptom of coronary artery disease (CAD), and cardiac catheterization can show whether plaque is narrowing or blocking your heart's arteries. This procedure is also used to evaluate the valves, as well as measure the blood flow and oxygen levels in different parts of your heart. For further information please visit [email or web address] Please follow instruction sheet, as given.  You will need to have blood work & a chest x-ray TODAY Please go to [phone] [employer] do not need an appointment

## 2014-11-29 NOTE — Progress Notes (Signed)
OFFICE NOTE  Chief Complaint:  Progressive angina  Primary Care Physician: Desmond Dike, MD  HPI:  Earl Keller is a 74 y.o. male with a history of coronary artery bypass grafting in 2003. In 2012 and non-ST elevation myocardial infarction and catheterization at that time showed patent grafts with small vessel disease and distal disease of native vessels. The ejection fraction that time was 45%. The patient has been doing well on medical therapy with no complaints of angina. He states he is quite active does some hiking and biking fairly regularly without any concerns. He currently denies nausea, vomiting, fever, chest pain, shortness of breath, orthopnea, dizziness, PND, cough, congestion, abdominal pain, hematochezia, melena, lower extremity edema, claudication.  He was previously on Ranexa and isosorbide.   I saw [name] back in the office today. He reports that since January he's been developing a dull aching chest pain. The symptoms came on when walking after about a mile and improved a little bit. Recently he's been having symptoms that are lasting more during a walk and eventually has gotten to a point now where he cannot go on longer walks. He called the office and was restarted on his indoor and continues to have chest pain. He's had 2 episodes of chest discomfort in the past 2 days were taken nitroglycerin which provided some relief. We did note that his nitroglycerin as it is somewhat expired however does burn minimally under his tongue. He reported that initially had improvement in his pain however the symptoms came back after about an hour or so. These are occurring now at rest suggesting that this is more significant unstable angina. EKG in the office shows a stable right bundle branch block with no new ischemic changes.  PMHx:  Past Medical History  Diagnosis Date  . CAD (coronary artery disease)     post bypass in 2003  . Dyslipidemia   . Hypertension     Past Surgical  History  Procedure Laterality Date  . Coronary artery bypass graft  01/2002    EF 45%  . Cardiac catheterization  2012    showed patent grafts with small- vessel disease and distal disease of the native vessels. 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 Sudden death Mother 46  . Liver disease Maternal Grandmother   . Anuerysm Maternal Grandfather   . Stroke Paternal Grandmother   . Heart attack Paternal Grandfather     SOCHx:   reports that he has never smoked. He has never used smokeless tobacco. He reports that he does not drink alcohol or use illicit drugs.  ALLERGIES:  Allergies  Allergen Reactions  . Ranexa [Ranolazine] Other (See Comments)    Vivid Dreams    ROS: A comprehensive review of systems was negative except for: Cardiovascular: positive for chest pressure/discomfort  HOME MEDS: Current Outpatient Prescriptions  Medication Sig Dispense Refill  . aspirin 81 MG tablet Take 81 mg by mouth daily.    Marland Kitchen atorvastatin (LIPITOR) 40 MG tablet Take 40 mg by mouth daily.    . isosorbide mononitrate (IMDUR) 60 MG 24 hr tablet Take 1 tablet (60 mg total) by mouth daily. 30 tablet 1  . metoprolol  tartrate (LOPRESSOR) 25 MG tablet Take 0.5 tablets (12.5 mg total) by mouth 2 (two) times daily. 30 tablet 10  . nitroGLYCERIN (NITROSTAT) 0.4 MG SL tablet Place 1 tablet (0.4 mg total) under the tongue every 5 (five) minutes as needed for chest pain. 25 tablet 3  . omeprazole (PRILOSEC) 20 MG capsule Take 20 mg by mouth daily.     No current facility-administered medications for this visit.    LABS/IMAGING: No results found for this or any previous visit (from the past 48 hour(s)). No results found.  VITALS: BP 126/78  mmHg  Pulse 60  Ht 6\' 2"  (1.88 m)  Wt 212 lb 3.2 oz (96.253 kg)  BMI 27.23 kg/m2  EXAM: General appearance: alert and no distress Neck: no carotid bruit and no JVD Lungs: clear to auscultation bilaterally Heart: regular rate and rhythm, S1, S2 normal, no murmur, click, rub or gallop Abdomen: soft, non-tender; bowel sounds normal; no masses,  no organomegaly Extremities: extremities normal, atraumatic, no cyanosis or edema Pulses: 2+ and symmetric Skin: Skin color, texture, turgor normal. No rashes or lesions Neurologic: Grossly normal Psych: Mood, affect normal  EKG: Normal sinus rhythm at 60 with right bundle branch block and first-degree AV block, PR interval 202 ms  ASSESSMENT: 1. Unstable angina 2. CAD s/p CABG x 5 in 2003 3. Dyslipidemia 4. Ischemic cardioymyopathy, EF 45% 5. HTN  PLAN: 1.   [name] presents today with a good story for unstable or progressive angina. He's had symptoms that are been worsening over the past month and are not improved with the addition of isosorbide. He's taken short acting nitroglycerin with some relief however it does not last long. He's no longer able to do exercise because of angina. I suspect this could represent graft dysfunction. I recommend a cardiac catheterization sooner than later. They are being on going out of town next week therefore he needs to be seen sooner. I do not have any catheterization availability this week however hopefully we can get him scheduled by one of my partners for cardiac catheterization tomorrow. If it turns out he needs an intervention they would be the ones to do it anyhow. We did discuss risks and benefits of cardiac catheterization and he did provide informed consent the office today. I have renewed his nitroglycerin so that he can get fresh tablets.  Plan follow-up post catheterization.  Luan Pulling, MD, [facility] Attending Cardiologist [facility]  Eldo Umanzor C 11/29/2014, 12:59 PM

## 2014-11-30 ENCOUNTER — Encounter (HOSPITAL_COMMUNITY): Payer: Self-pay | Admitting: General Practice

## 2014-11-30 ENCOUNTER — Encounter (HOSPITAL_COMMUNITY)
Admission: RE | Disposition: A | Discharge status: Home / Self Care | Payer: Medicare PPO | Source: Ambulatory Visit | Attending: Cardiovascular Disease

## 2014-11-30 ENCOUNTER — Ambulatory Visit (HOSPITAL_COMMUNITY)
Admission: RE | Admit: 2014-11-30 | Discharge: 2014-12-01 | Disposition: A | Discharge status: Home / Self Care | Payer: Medicare PPO | Source: Ambulatory Visit | Attending: Cardiovascular Disease | Admitting: Cardiovascular Disease

## 2014-11-30 DIAGNOSIS — I739 Peripheral vascular disease, unspecified: Secondary | ICD-10-CM | POA: Insufficient documentation

## 2014-11-30 DIAGNOSIS — I255 Ischemic cardiomyopathy: Secondary | ICD-10-CM | POA: Insufficient documentation

## 2014-11-30 DIAGNOSIS — I200 Unstable angina: Secondary | ICD-10-CM | POA: Diagnosis present

## 2014-11-30 DIAGNOSIS — Z79899 Other long term (current) drug therapy: Secondary | ICD-10-CM

## 2014-11-30 DIAGNOSIS — E785 Hyperlipidemia, unspecified: Secondary | ICD-10-CM | POA: Diagnosis not present

## 2014-11-30 DIAGNOSIS — I25110 Atherosclerotic heart disease of native coronary artery with unstable angina pectoris: Secondary | ICD-10-CM | POA: Insufficient documentation

## 2014-11-30 DIAGNOSIS — I2510 Atherosclerotic heart disease of native coronary artery without angina pectoris: Secondary | ICD-10-CM | POA: Diagnosis present

## 2014-11-30 DIAGNOSIS — Z7982 Long term (current) use of aspirin: Secondary | ICD-10-CM | POA: Insufficient documentation

## 2014-11-30 DIAGNOSIS — I25810 Atherosclerosis of coronary artery bypass graft(s) without angina pectoris: Secondary | ICD-10-CM | POA: Diagnosis present

## 2014-11-30 DIAGNOSIS — I252 Old myocardial infarction: Secondary | ICD-10-CM | POA: Insufficient documentation

## 2014-11-30 DIAGNOSIS — I25710 Atherosclerosis of autologous vein coronary artery bypass graft(s) with unstable angina pectoris: Secondary | ICD-10-CM | POA: Insufficient documentation

## 2014-11-30 DIAGNOSIS — Z951 Presence of aortocoronary bypass graft: Secondary | ICD-10-CM

## 2014-11-30 DIAGNOSIS — Z01818 Encounter for other preprocedural examination: Secondary | ICD-10-CM

## 2014-11-30 DIAGNOSIS — I10 Essential (primary) hypertension: Secondary | ICD-10-CM | POA: Diagnosis not present

## 2014-11-30 DIAGNOSIS — K219 Gastro-esophageal reflux disease without esophagitis: Secondary | ICD-10-CM | POA: Diagnosis present

## 2014-11-30 DIAGNOSIS — I2582 Chronic total occlusion of coronary artery: Secondary | ICD-10-CM | POA: Diagnosis not present

## 2014-11-30 HISTORY — PX: CARDIAC CATHETERIZATION: SHX172

## 2014-11-30 HISTORY — DX: Adverse effect of unspecified anesthetic, initial encounter: T41.45XA

## 2014-11-30 HISTORY — DX: Rheumatoid arthritis, unspecified: M06.9

## 2014-11-30 HISTORY — DX: Gastro-esophageal reflux disease without esophagitis: K21.9

## 2014-11-30 HISTORY — PX: CORONARY ANGIOPLASTY WITH STENT PLACEMENT: SHX49

## 2014-11-30 HISTORY — DX: Other complications of anesthesia, initial encounter: T88.59XA

## 2014-11-30 LAB — POCT ACTIVATED CLOTTING TIME: ACTIVATED CLOTTING TIME: 368 s

## 2014-11-30 LAB — TSH: TSH: 1.509 u[IU]/mL (ref 0.350–4.500)

## 2014-11-30 SURGERY — LEFT HEART CATH AND CORS/GRAFTS ANGIOGRAPHY

## 2014-11-30 MED ORDER — METOPROLOL TARTRATE 12.5 MG HALF TABLET
12.5000 mg | ORAL_TABLET | Freq: Two times a day (BID) | ORAL | Status: DC
  Administered 2014-11-30 – 2014-12-01 (×2): 12.5 mg via ORAL
  Filled 2014-11-30 (×4): qty 1

## 2014-11-30 MED ORDER — SODIUM CHLORIDE 0.9 % IJ SOLN: 3.0000 mL | INTRAMUSCULAR | Status: DC | PRN

## 2014-11-30 MED ORDER — ATORVASTATIN CALCIUM 40 MG PO TABS
40.0000 mg | ORAL_TABLET | Freq: Every day | ORAL | Status: DC
Start: 2014-11-30 — End: 2014-12-01
  Administered 2014-11-30: 40 mg via ORAL
  Filled 2014-11-30 (×2): qty 1

## 2014-11-30 MED ORDER — ADENOSINE 6 MG/2ML IV SOLN
INTRAVENOUS | Status: AC
  Filled 2014-11-30: qty 2

## 2014-11-30 MED ORDER — HEPARIN (PORCINE) IN NACL 2-0.9 UNIT/ML-% IJ SOLN
INTRAMUSCULAR | Status: AC
  Filled 2014-11-30: qty 1500

## 2014-11-30 MED ORDER — FENTANYL CITRATE 0.05 MG/ML IJ SOLN
INTRAMUSCULAR | Status: AC
  Filled 2014-11-30: qty 2

## 2014-11-30 MED ORDER — CLOPIDOGREL BISULFATE 75 MG PO TABS
75.0000 mg | ORAL_TABLET | Freq: Every day | ORAL | Status: DC
  Administered 2014-12-01: 08:00:00 75 mg via ORAL
  Filled 2014-11-30: qty 1

## 2014-11-30 MED ORDER — ASPIRIN 81 MG PO CHEW
81.0000 mg | CHEWABLE_TABLET | Freq: Every day | ORAL | Status: DC
  Administered 2014-12-01: 08:00:00 81 mg via ORAL
  Filled 2014-11-30 (×2): qty 1

## 2014-11-30 MED ORDER — SODIUM CHLORIDE 0.9 % IV SOLN: INTRAVENOUS | Status: AC

## 2014-11-30 MED ORDER — ASPIRIN 81 MG PO CHEW
81.0000 mg | CHEWABLE_TABLET | ORAL | Status: DC
  Filled 2014-11-30: qty 1

## 2014-11-30 MED ORDER — LIDOCAINE HCL (PF) 1 % IJ SOLN
INTRAMUSCULAR | Status: AC
  Filled 2014-11-30: qty 30

## 2014-11-30 MED ORDER — NITROGLYCERIN 1 MG/10 ML FOR IR/CATH LAB
INTRA_ARTERIAL | Status: AC
  Filled 2014-11-30: qty 10

## 2014-11-30 MED ORDER — ISOSORBIDE MONONITRATE ER 60 MG PO TB24
60.0000 mg | ORAL_TABLET | Freq: Every day | ORAL | Status: DC
  Administered 2014-12-01: 08:00:00 60 mg via ORAL
  Filled 2014-11-30 (×2): qty 1

## 2014-11-30 MED ORDER — MIDAZOLAM HCL 2 MG/2ML IJ SOLN
INTRAMUSCULAR | Status: AC
  Filled 2014-11-30: qty 2

## 2014-11-30 MED ORDER — ACETAMINOPHEN 325 MG PO TABS: 650.0000 mg | ORAL_TABLET | ORAL | Status: DC | PRN

## 2014-11-30 MED ORDER — ONDANSETRON HCL 4 MG/2ML IJ SOLN: 4.0000 mg | Freq: Four times a day (QID) | INTRAMUSCULAR | Status: DC | PRN

## 2014-11-30 MED ORDER — NITROGLYCERIN 0.4 MG SL SUBL: 0.4000 mg | SUBLINGUAL_TABLET | SUBLINGUAL | Status: DC | PRN

## 2014-11-30 MED ORDER — BIVALIRUDIN 250 MG IV SOLR
INTRAVENOUS | Status: AC
  Filled 2014-11-30: qty 250

## 2014-11-30 MED ORDER — SODIUM CHLORIDE 0.9 % IV SOLN
INTRAVENOUS | Status: DC
  Administered 2014-11-30: 08:00:00 via INTRAVENOUS

## 2014-11-30 NOTE — H&P (View-Only) (Signed)
  OFFICE NOTE  Chief Complaint:  Progressive angina  Primary Care Physician: CAMERON,JOHN, MD  HPI:  Earl Keller is a 74 y.o. male with a history of coronary artery bypass grafting in 2003. In 2012 and non-ST elevation myocardial infarction and catheterization at that time showed patent grafts with small vessel disease and distal disease of native vessels. The ejection fraction that time was 45%. The patient has been doing well on medical therapy with no complaints of angina. He states he is quite active does some hiking and biking fairly regularly without any concerns. He currently denies nausea, vomiting, fever, chest pain, shortness of breath, orthopnea, dizziness, PND, cough, congestion, abdominal pain, hematochezia, melena, lower extremity edema, claudication.  He was previously on Ranexa and isosorbide.   I saw Earl Keller back in the office today. He reports that since January he's been developing a dull aching chest pain. The symptoms came on when walking after about a mile and improved a little bit. Recently he's been having symptoms that are lasting more during a walk and eventually has gotten to a point now where he cannot go on longer walks. He called the office and was restarted on his indoor and continues to have chest pain. He's had 2 episodes of chest discomfort in the past 2 days were taken nitroglycerin which provided some relief. We did note that his nitroglycerin as it is somewhat expired however does burn minimally under his tongue. He reported that initially had improvement in his pain however the symptoms came back after about an hour or so. These are occurring now at rest suggesting that this is more significant unstable angina. EKG in the office shows a stable right bundle branch block with no new ischemic changes.  PMHx:  Past Medical History  Diagnosis Date  . CAD (coronary artery disease)     post bypass in 2003  . Dyslipidemia   . Hypertension     Past Surgical  History  Procedure Laterality Date  . Coronary artery bypass graft  01/2002    EF 45%  . Cardiac catheterization  2012    showed patent grafts with small- vessel disease and distal disease of the native vessels. 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 Sudden death Mother 69  . Liver disease Maternal Grandmother   . Anuerysm Maternal Grandfather   . Stroke Paternal Grandmother   . Heart attack Paternal Grandfather     SOCHx:   reports that he has never smoked. He has never used smokeless tobacco. He reports that he does not drink alcohol or use illicit drugs.  ALLERGIES:  Allergies  Allergen Reactions  . Ranexa [Ranolazine] Other (See Comments)    Vivid Dreams    ROS: A comprehensive review of systems was negative except for: Cardiovascular: positive for chest pressure/discomfort  HOME MEDS: Current Outpatient Prescriptions  Medication Sig Dispense Refill  . aspirin 81 MG tablet Take 81 mg by mouth daily.    . atorvastatin (LIPITOR) 40 MG tablet Take 40 mg by mouth daily.    . isosorbide mononitrate (IMDUR) 60 MG 24 hr tablet Take 1 tablet (60 mg total) by mouth daily. 30 tablet 1  . metoprolol   tartrate (LOPRESSOR) 25 MG tablet Take 0.5 tablets (12.5 mg total) by mouth 2 (two) times daily. 30 tablet 10  . nitroGLYCERIN (NITROSTAT) 0.4 MG SL tablet Place 1 tablet (0.4 mg total) under the tongue every 5 (five) minutes as needed for chest pain. 25 tablet 3  . omeprazole (PRILOSEC) 20 MG capsule Take 20 mg by mouth daily.     No current facility-administered medications for this visit.    LABS/IMAGING: No results found for this or any previous visit (from the past 48 hour(s)). No results found.  VITALS: BP 126/78  mmHg  Pulse 60  Ht 6' 2" (1.88 m)  Wt 212 lb 3.2 oz (96.253 kg)  BMI 27.23 kg/m2  EXAM: General appearance: alert and no distress Neck: no carotid bruit and no JVD Lungs: clear to auscultation bilaterally Heart: regular rate and rhythm, S1, S2 normal, no murmur, click, rub or gallop Abdomen: soft, non-tender; bowel sounds normal; no masses,  no organomegaly Extremities: extremities normal, atraumatic, no cyanosis or edema Pulses: 2+ and symmetric Skin: Skin color, texture, turgor normal. No rashes or lesions Neurologic: Grossly normal Psych: Mood, affect normal  EKG: Normal sinus rhythm at 60 with right bundle branch block and first-degree AV block, PR interval 202 ms  ASSESSMENT: 1. Unstable angina 2. CAD s/p CABG x 5 in 2003 3. Dyslipidemia 4. Ischemic cardioymyopathy, EF 45% 5. HTN  PLAN: 1.   Earl Keller presents today with a good story for unstable or progressive angina. He's had symptoms that are been worsening over the past month and are not improved with the addition of isosorbide. He's taken short acting nitroglycerin with some relief however it does not last long. He's no longer able to do exercise because of angina. I suspect this could represent graft dysfunction. I recommend a cardiac catheterization sooner than later. They are being on going out of town next week therefore he needs to be seen sooner. I do not have any catheterization availability this week however hopefully we can get him scheduled by one of my partners for cardiac catheterization tomorrow. If it turns out he needs an intervention they would be the ones to do it anyhow. We did discuss risks and benefits of cardiac catheterization and he did provide informed consent the office today. I have renewed his nitroglycerin so that he can get fresh tablets.  Plan follow-up post catheterization.  Kenneth C. Hilty, MD, FACC Attending Cardiologist [facility]  HILTY,Kenneth C 11/29/2014, 12:59 PM  

## 2014-11-30 NOTE — Interval H&P Note (Signed)
History and Physical Interval Note:  11/30/2014 8:45 AM  Earl Keller  has presented today for cardiac cath with the diagnosis of unstable angina, CAD s/p CABG in 2003.  The various methods of treatment have been discussed with the patient and family. After consideration of risks, benefits and other options for treatment, the patient has consented to  Procedure(s): LEFT HEART CATHETERIZATION WITH CORONARY ANGIOGRAM (N/A) as a surgical intervention .  The patient's history has been reviewed, patient examined, no change in status, stable for surgery.  I have reviewed the patient's chart and labs.  Questions were answered to the patient's satisfaction.    Cath Lab Visit (complete for each Cath Lab visit)  Clinical Evaluation Leading to the Procedure:   ACS: No.  Non-ACS:    Anginal Classification: CCS III  Anti-ischemic medical therapy: Maximal Therapy (2 or more classes of medications)  Non-Invasive Test Results: No non-invasive testing performed  Prior CABG: Previous CABG         MCALHANY,CHRISTOPHER

## 2014-11-30 NOTE — CV Procedure (Signed)
Cardiac Catheterization Operative Report  Earl Keller [id number] 2/5/20168:58 AM Desmond Dike, MD  Procedure Performed:  1. Left Heart Catheterization 2. Selective Coronary Angiography 3. SVG angiography 4. LIMA graft angiography 5. Left ventricular angiogram 6. PTCA/DES x 1 proximal body of SVG to intermediate branch 7. Angioseal right femoral artery  Operator: [name], MD  Indication: 74 yo male with history of 5V CABG in 2003 with most recent cath September 2012 with 4/5 patent grafts (SVG to Diagonal occluded) but noted to have severe disease in native targets with no options for PCI at that time. Recently having chest pain c/w unstable angina.                                   Procedure Details: The risks, benefits, complications, treatment options, and expected outcomes were discussed with the patient. The patient and/or family concurred with the proposed plan, giving informed consent. The patient was brought to the cath lab after IV hydration was begun and oral premedication was given. The patient was further sedated with Versed and Fentanyl. The right groin was prepped and draped in the usual manner. Using the modified Seldinger access technique, a 5 French sheath was placed in the right femoral artery. Standard diagnostic catheters were used to perform selective coronary angiography. The JR4 catheter was used to engage the RCA, the LIMA graft and all vein grafts. A pigtail catheter was used to perform a left ventricular angiogram. He was found to have severe stenosis in the ostium of the SVG to the intermediate branch. I elected to proceed to PCI of this vein graft.   PCI Note: He was given 600 mg Plavix po x 1. Angiomax weight based bolus given and drip started. When the ACT was over 200, I engaged the SVG to the intermediate branch with a 6 French LCB guiding catheter. I then passed a Cougar IC wire down the SVG into the target intermediate branch. I did not  use a distal protection device given the short distance of the graft to the target. I also needed to make guide manipulations with the ostial location of the stenosis and did not want to have frequent drift of the distal protection device. A 2.5 x 12 mm balloon was used to pre-dilate the ostial stenosis x 1. I then deployed a 3.0 x 16 mm Promus Premier DES in the ostium of the SVG. The stent was post-dilated x 1 with a 3.25 x 12 mm [place] balloon x 1. The stenosis was taken from 99% down to 10%. He was given 24 mcg IC adenosine. There was excellent flow into the target vessel. Angioseal placed right femoral artery.   There were no immediate complications. The patient was taken to the recovery area in stable condition.   Hemodynamic Findings: Central aortic pressure: 118/58 Left ventricular pressure: 119/6/20  Angiographic Findings:  Left main: 10% ostial stenosis.   Left Anterior Descending Artery: Moderate caliber vessel that courses to the apex. The proximal vessel has diffuse 60-70% stenosis supplying a small diagonal branch. The mid and distal vessel fills antegrade and from the patent IMA graft.   Circumflex Artery: 100% ostial occlusion. The intermediate branch fills from the patent vein graft. The obtuse marginal branch fills from right to left collaterals (through filling of the SVG to the PDA).    Right Coronary Artery: 100% mid occlusion. The PDA and PLA fill from the patent  vein graft. There is severe stenosis in the native PDA retrograde from the graft insertion but this is unchanged from last cath in 2012. This leads to the PLA.   Graft Anatomy:  SVG to Diagonal is occluded SVG sequential to intermediate branch and obtuse marginal is patent to the intermediate but occluded distal limb to the obtuse marginal.  SVG to PDA is patent with 50% anastomosis lesion into the PDA (unchanged from last cath) and as noted above, there is severe disease in the PDA in the more proximal portion of the  vessel than where the graft inserts. This supplies the PLA retrograde and is unchanged from last cath. (Unfavorable target for PCI).  LIMA graft to mid LAD is patent. 40% ostial stenosis IMA, unchanged from last cath.   Left Ventricular Angiogram: LVEF=40% with anterior wall hypokinesis. Mild MR.   Impression: 1. 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  2. 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 Mild to moderate LV systolic dysfunction 4. Successful PTCA/DES x 1 ostium of SVG to intermediate branch.   Recommendations: He will need dual anti-platelet therapy with ASA and Plavix for at least one year.        Complications:  None. The patient tolerated the procedure well.

## 2014-12-01 ENCOUNTER — Encounter (HOSPITAL_COMMUNITY): Payer: Self-pay | Admitting: Physician Assistant

## 2014-12-01 DIAGNOSIS — I739 Peripheral vascular disease, unspecified: Secondary | ICD-10-CM | POA: Diagnosis not present

## 2014-12-01 DIAGNOSIS — I2582 Chronic total occlusion of coronary artery: Secondary | ICD-10-CM | POA: Diagnosis not present

## 2014-12-01 DIAGNOSIS — I200 Unstable angina: Secondary | ICD-10-CM

## 2014-12-01 DIAGNOSIS — I25110 Atherosclerotic heart disease of native coronary artery with unstable angina pectoris: Secondary | ICD-10-CM | POA: Diagnosis not present

## 2014-12-01 DIAGNOSIS — I252 Old myocardial infarction: Secondary | ICD-10-CM | POA: Diagnosis not present

## 2014-12-01 LAB — BASIC METABOLIC PANEL
Anion gap: 6 (ref 5–15)
BUN: 12 mg/dL (ref 6–23)
CHLORIDE: 107 mmol/L (ref 96–112)
CO2: 25 mmol/L (ref 19–32)
Calcium: 8.7 mg/dL (ref 8.4–10.5)
Creatinine, Ser: 1.05 mg/dL (ref 0.50–1.35)
GFR calc Af Amer: 80 mL/min — ABNORMAL LOW (ref >90)
GFR, EST NON AFRICAN AMERICAN: 69 mL/min — AB (ref >90)
Glucose, Bld: 106 mg/dL — ABNORMAL HIGH (ref 70–99)
Potassium: 4.1 mmol/L (ref 3.5–5.1)
SODIUM: 138 mmol/L (ref 135–145)

## 2014-12-01 LAB — CBC
HCT: 44.9 % (ref 39.0–52.0)
HEMOGLOBIN: 15 g/dL (ref 13.0–17.0)
MCH: 28.6 pg (ref 26.0–34.0)
MCHC: 33.4 g/dL (ref 30.0–36.0)
MCV: 85.7 fL (ref 78.0–100.0)
PLATELETS: 162 10*3/uL (ref 150–400)
RBC: 5.24 MIL/uL (ref 4.22–5.81)
RDW: 13.5 % (ref 11.5–15.5)
WBC: 8 10*3/uL (ref 4.0–10.5)

## 2014-12-01 LAB — TROPONIN I: Troponin I: 0.47 ng/mL — ABNORMAL HIGH (ref <0.031)

## 2014-12-01 MED ORDER — PANTOPRAZOLE SODIUM 20 MG PO TBEC: 20.0000 mg | DELAYED_RELEASE_TABLET | Freq: Every day | ORAL | Status: DC

## 2014-12-01 MED ORDER — LOSARTAN POTASSIUM 25 MG PO TABS: 25.0000 mg | ORAL_TABLET | Freq: Every day | ORAL | Status: DC

## 2014-12-01 MED ORDER — CLOPIDOGREL BISULFATE 75 MG PO TABS: 75.0000 mg | ORAL_TABLET | Freq: Every day | ORAL | Status: DC

## 2014-12-01 MED ORDER — LOSARTAN POTASSIUM 25 MG PO TABS
25.0000 mg | ORAL_TABLET | Freq: Every day | ORAL | Status: DC
  Administered 2014-12-01: 25 mg via ORAL
  Filled 2014-12-01: qty 1

## 2014-12-01 NOTE — Progress Notes (Signed)
Discharge Summary   Patient ID: Earl Keller MRN: [id number], DOB/AGE: 1941-10-05 74 y.o. Admit date: 11/30/2014 D/C date:     12/01/2014  Primary Cardiologist: Dr. Rennis Golden  Principal Problem:   Unstable angina Active Problems:   Coronary artery disease   S/P CABG x 5   Cardiomyopathy, ischemic.  Ejection fraction 45% by LV gram during cath 07/21/2011   Dyslipidemia   GERD (gastroesophageal reflux disease)    Admission Dates: 11/30/14-12/01/14 Discharge Diagnosis: [place] s/p PTCA/DES x 1 ostium of SVG to intermediate branch.   HPI: Earl Keller is a 74 y.o. male with a history of CAD s/p CABG (2003), HTN, HLD, ischemic CM (EF 45%), GERD and rheumatoid arthritis who was seen in the office on 11/29/14 with sx concerning for unstable angina. He was admitted to [facility] on 11/30/14 for planned coronary angiography.  He has a history of 5V CABG in 2003 with most recent cath September 2012 with 4/5 patent grafts (SVG to Diagonal occluded) but noted to have severe disease in native targets with no options for PCI at that time. Recently having chest pain c/w unstable angina and referred for cardiac cath.  Hospital Course Unstable angina- A troponin was drawn which returned slightly elevated at 0.47 -- s/p [facility] 11/30/14 which revealed 1. 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  2. 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 Mild to moderate LV systolic dysfunction: LVEF=40% with anterior wall hypokinesis. Mild MR.  4. Successful PTCA/DES x 1 ostium of SVG to intermediate branch.  -- Femoral site stable  -- He will need dual anti-platelet therapy with ASA and Plavix for at least one year. Continue statin, lopresor 12.5 mg BID and imdur 60mg .   HTN- well controlled on lopressor 12.5 mg BID and imdur 60mg . Will add 25mg  Losartan in the setting of ICM.   HLD- continue statin   Ischemic CM- EF 40% with anterior wall  hypokinesis by LV gram. -- No s/s volume overload.  -- Continue BB. Will add 25mg  Losartan, which can be up-titrated as an outpatient as blood pressure and renal function allow.  GERD- will change omeprazole to protonix in the setting of plavix therapy.    The patient has had an uncomplicated hospital course and is recovering well. The femoral catheter site is stable. He has been seen by Dr. Ladona Ridgel today and deemed ready for discharge home. All follow-up appointments have been scheduled.  Discharge medications are listed below.   Discharge Vitals: Blood pressure 157/81, pulse 60, temperature 97.5 F (36.4 C), temperature source Oral, resp. rate 18, height 6\' 2"  (1.88 m), weight 212 lb 1.3 oz (96.2 kg), SpO2 94 %.  Labs: Lab Results  Component Value Date   WBC 8.0 12/01/2014   HGB 15.0 12/01/2014   HCT 44.9 12/01/2014   MCV 85.7 12/01/2014   PLT 162 12/01/2014     Recent Labs Lab 12/01/14 0551  NA 138  K 4.1  CL 107  CO2 25  BUN 12  CREATININE 1.05  CALCIUM 8.7  GLUCOSE 106*    Recent Labs  12/01/14 0551  TROPONINI 0.47*   Lab Results  Component Value Date   CHOL 135 07/21/2011   HDL 39* 07/21/2011   LDLCALC 79 07/21/2011   TRIG 85 07/21/2011     Diagnostic Studies/Procedures   Dg Chest 2 View  11/29/2014   CLINICAL DATA:  Preop cardiac cath  EXAM: CHEST  2 VIEW  COMPARISON:  07/20/2011  FINDINGS: Chronic interstitial markings. No focal consolidation. Mild scarring in the medial right upper lobe. No pleural effusion or pneumothorax.  The heart is normal in size. Prominent epicardial fat along the left heart border. Postsurgical changes related to prior CABG.  Visualized osseous structures are within normal limits.  IMPRESSION: No evidence of acute cardiopulmonary disease.   Electronically Signed   By: Charline Bills M.D.   On: 11/29/2014 22:37    11/30/14 Cardiac Catheterization Operative Report Procedure Performed:  1. Left Heart  Catheterization 2. Selective Coronary Angiography 3. SVG angiography 4. LIMA graft angiography 5. Left ventricular angiogram 6. PTCA/DES x 1 proximal body of SVG to intermediate branch 7. Angioseal right femoral artery  Hemodynamic Findings: Central aortic pressure: 118/58 Left ventricular pressure: 119/6/20  Angiographic Findings:  Left main: 10% ostial stenosis.   Left Anterior Descending Artery: Moderate caliber vessel that courses to the apex. The proximal vessel has diffuse 60-70% stenosis supplying a small diagonal branch. The mid and distal vessel fills antegrade and from the patent IMA graft.   Circumflex Artery: 100% ostial occlusion. The intermediate branch fills from the patent vein graft. The obtuse marginal branch fills from right to left collaterals (through filling of the SVG to the PDA).   Right Coronary Artery: 100% mid occlusion. The PDA and PLA fill from the patent vein graft. There is severe stenosis in the native PDA retrograde from the graft insertion but this is unchanged from last cath in 2012. This leads to the PLA.   Graft Anatomy:  SVG to Diagonal is occluded SVG sequential to intermediate branch and obtuse marginal is patent to the intermediate but occluded distal limb to the obtuse marginal.  SVG to PDA is patent with 50% anastomosis lesion into the PDA (unchanged from last cath) and as noted above, there is severe disease in the PDA in the more proximal portion of the vessel than where the graft inserts. This supplies the PLA retrograde and is unchanged from last cath. (Unfavorable target for PCI).  LIMA graft to mid LAD is patent. 40% ostial stenosis IMA, unchanged from last cath.   Left Ventricular Angiogram: LVEF=40% with anterior wall hypokinesis. Mild MR.   Impression: 1. 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  2. 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 Mild to moderate LV systolic dysfunction 4.  Successful PTCA/DES x 1 ostium of SVG to intermediate branch.    Discharge Medications     Medication List    STOP taking these medications        omeprazole 20 MG capsule  Commonly known as:  PRILOSEC      TAKE these medications        aspirin 81 MG tablet  Take 81 mg by mouth daily.     atorvastatin 40 MG tablet  Commonly known as:  LIPITOR  Take 40 mg by mouth daily.     clopidogrel 75 MG tablet  Commonly known as:  PLAVIX  Take 1 tablet (75 mg total) by mouth daily with breakfast.     isosorbide mononitrate 60 MG 24 hr tablet  Commonly known as:  IMDUR  Take 1 tablet (60 mg total) by mouth daily.     losartan 25 MG tablet  Commonly known as:  COZAAR  Take 1 tablet (25 mg total) by mouth daily.     metoprolol tartrate 25 MG tablet  Commonly known as:  LOPRESSOR  Take 0.5 tablets (12.5 mg total)  by mouth 2 (two) times daily.     nitroGLYCERIN 0.4 MG SL tablet  Commonly known as:  NITROSTAT  Place 1 tablet (0.4 mg total) under the tongue every 5 (five) minutes as needed for chest pain.     pantoprazole 20 MG tablet  Commonly known as:  PROTONIX  Take 1 tablet (20 mg total) by mouth daily.        Disposition   The patient will be discharged in stable condition to home.  Follow-up Information    Follow up with Chrystie Nose, MD.   Specialty:  Cardiology   Why:  The office will call you to make an appoinment., If you do not hear from them, please contact them., You should be seen within 1-2 weeks.   Contact information:   [address] [place] [address] [phone]         Duration of Discharge Encounter: Greater than 30 minutes including physician and PA time.  SignedCline Crock R PA-C 12/01/2014, 8:23 AM   EP Attending  Patient seen and examined. Agree with above.  Leonia Reeves.D.

## 2014-12-01 NOTE — Progress Notes (Signed)
Patient Name: Earl Keller Date of Encounter: 12/01/2014     Principal Problem:   Unstable angina Active Problems:   Coronary artery disease   S/P CABG x 5   Cardiomyopathy, ischemic.  Ejection fraction 45% by LV gram during cath 07/21/2011   Dyslipidemia   GERD (gastroesophageal reflux disease)    SUBJECTIVE  No CP or SOB.   CURRENT MEDS . aspirin  81 mg Oral Daily  . atorvastatin  40 mg Oral q1800  . clopidogrel  75 mg Oral Q breakfast  . isosorbide mononitrate  60 mg Oral Daily  . metoprolol tartrate  12.5 mg Oral BID    OBJECTIVE  Filed Vitals:   11/30/14 1950 11/30/14 2059 11/30/14 2336 12/01/14 0416  BP: 161/75  132/70 129/77  Pulse: 65 60 55 55  Temp: 97.8 F (36.6 C)  97.6 F (36.4 C) 98.6 F (37 C)  TempSrc: Oral  Oral Oral  Resp: 20  20 20   Height:      Weight:    212 lb 1.3 oz (96.2 kg)  SpO2:    93%    Intake/Output Summary (Last 24 hours) at 12/01/14 0720 Last data filed at 11/30/14 1914  Gross per 24 hour  Intake  942.5 ml  Output      0 ml  Net  942.5 ml   Filed Weights   11/30/14 0715 12/01/14 0416  Weight: 203 lb (92.08 kg) 212 lb 1.3 oz (96.2 kg)    PHYSICAL EXAM  General: Pleasant, NAD. Neuro: Alert and oriented X 3. Moves all extremities spontaneously. Psych: Normal affect. HEENT:  Normal  Neck: Supple without bruits or JVD. Lungs:  Resp regular and unlabored, CTA. Heart: RRR no s3, s4, or murmurs. Abdomen: Soft, non-tender, non-distended, BS + x 4.  Extremities: No clubbing, cyanosis or edema. DP/PT/Radials 2+ and equal bilaterally.  Accessory Clinical Findings  CBC  Recent Labs  11/29/14 1214 12/01/14 0551  WBC 7.0 8.0  HGB 15.3 15.0  HCT 45.7 44.9  MCV 86.1 85.7  PLT 198 162   Basic Metabolic Panel  Recent Labs  11/29/14 1214 12/01/14 0551  NA 141 138  K 4.8 4.1  CL 107 107  CO2 28 25  GLUCOSE 103* 106*  BUN 15 12  CREATININE 1.05 1.05  CALCIUM 9.3 8.7   Cardiac Enzymes  Recent Labs   12/01/14 0551  TROPONINI 0.47*   Thyroid Function Tests  Recent Labs  11/29/14 1214  TSH 1.509    TELE  NSR with some sinus brady overnight.    Radiology/Studies  Dg Chest 2 View  11/29/2014   CLINICAL DATA:  Preop cardiac cath  EXAM: CHEST  2 VIEW  COMPARISON:  07/20/2011  FINDINGS: Chronic interstitial markings. No focal consolidation. Mild scarring in the medial right upper lobe. No pleural effusion or pneumothorax.  The heart is normal in size. Prominent epicardial fat along the left heart border. Postsurgical changes related to prior CABG.  Visualized osseous structures are within normal limits.  IMPRESSION: No evidence of acute cardiopulmonary disease.   Electronically Signed   By: 07/22/2011 M.D.   On: 11/29/2014 22:37    ASSESSMENT AND PLAN Earl Keller is a 74 y.o. male with a history of CAD s/p CABG (2003), HTN, HLD, ischemic CM (EF 45%), GERD and rheumatoid arthritis who was seen in the office on 11/29/14 with sx concerning for unstable angina. He was admitted to [facility] on 11/30/14 for planned coronary angiography.  Unstable angina-  A troponin was drawn which returned slightly elevated at 0.47 -- s/p [facility] 11/30/14 which revealed  1. 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   2. Severe stenosis ostium SVG to intermediate branch  3. Mild to moderate LV systolic dysfunction: LVEF=40% with anterior wall hypokinesis. Mild MR.   4. Successful PTCA/DES x 1 ostium of SVG to intermediate branch.  -- Femoral site stable  -- He will need dual anti-platelet therapy with ASA and Plavix for at least one year. Continue statin, lopresor 12.5 mg BID and imdur 60mg .   HTN- well controlled on lopressor 12.5 mg BID and imdur 60mg .  Will add 25mg  Losartan in the setting of ICM.   HLD- continue statin   Ischemic CM- EF 40% with anterior wall hypokinesis by LV gram. -- No s/s volume overload.  -- Continue BB. Will add 25mg  Losartan which can be up-titrated as an  outpatient.   GERD- will change omeprazole to protonix in the setting of plavix therapy.    PA-C  Pager [phone]   Cardiology Attending  Patient seen and examined. Agree with the history/physical/assessment and plan. Ok for discharge home. With LV dysfunction, will start low dose ARB and advance as blood pressure and renal function allow.  Ok for discharge home.   .D.

## 2014-12-01 NOTE — Progress Notes (Signed)
CARDIAC REHAB PHASE I   PRE:  Rate/Rhythm: SR 60  BP:  Supine:   Sitting: 162/79  Standing:    SaO2: 94 RA  MODE:  Ambulation:  350 ft   POST:  Rate/Rhythm: SR 70  BP:  Supine:   Sitting: 182/71  Standing:    SaO2: 97  Pt ambulated in hallway 350 ft x 1 assist.  Pt denies any cp or sob. Did note some stiffness in his back which pt attributed to arthritis.  This improved as the walk progressed.  Pt anticipates d/c today.  Reviewed with pt exercise guidelines, activity restrictions, heart healthy diet,ntg protocol with alert 911 for non resolved cp and medication compliance to anti-platelet therapy.  Handouts provided, questions answered.  Pt wife at bedside.  Pt agreed for name and contact information be sent to Outpatient cardiac rehab phase II program in [place].  Pt participated in the program after his bypass surgery.  Pt in bed, call bell in place denies any needs. Alanson Aly, BSN [phone]

## 2014-12-01 NOTE — Discharge Instructions (Signed)

## 2014-12-03 MED FILL — Sodium Chloride IV Soln 0.9%: INTRAVENOUS | Qty: 50 | Status: AC

## 2014-12-04 ENCOUNTER — Telehealth: Payer: Self-pay | Admitting: Cardiology

## 2014-12-04 NOTE — Telephone Encounter (Signed)
Pt called, he forgot to take his medications this morning. I suggested he take his ASA and Plavix now and resume usual routine in am.  Corine Shelter PA-C 12/04/2014 6:52 PM

## 2014-12-05 ENCOUNTER — Telehealth: Payer: Self-pay | Admitting: Internal Medicine

## 2014-12-06 NOTE — Telephone Encounter (Signed)
Close encounter 

## 2014-12-11 ENCOUNTER — Encounter: Payer: Self-pay | Admitting: Internal Medicine

## 2014-12-11 ENCOUNTER — Ambulatory Visit (INDEPENDENT_AMBULATORY_CARE_PROVIDER_SITE_OTHER): Payer: Medicare PPO | Admitting: Internal Medicine

## 2014-12-11 VITALS — BP 132/80 | HR 50 | Ht 74.0 in | Wt 207.2 lb

## 2014-12-11 DIAGNOSIS — I2583 Coronary atherosclerosis due to lipid rich plaque: Principal | ICD-10-CM

## 2014-12-11 DIAGNOSIS — M1000 Idiopathic gout, unspecified site: Secondary | ICD-10-CM

## 2014-12-11 DIAGNOSIS — I2510 Atherosclerotic heart disease of native coronary artery without angina pectoris: Secondary | ICD-10-CM

## 2014-12-11 DIAGNOSIS — Z79899 Other long term (current) drug therapy: Secondary | ICD-10-CM

## 2014-12-11 DIAGNOSIS — Z951 Presence of aortocoronary bypass graft: Secondary | ICD-10-CM

## 2014-12-11 DIAGNOSIS — E785 Hyperlipidemia, unspecified: Secondary | ICD-10-CM

## 2014-12-11 DIAGNOSIS — I255 Ischemic cardiomyopathy: Secondary | ICD-10-CM

## 2014-12-11 DIAGNOSIS — M109 Gout, unspecified: Secondary | ICD-10-CM | POA: Insufficient documentation

## 2014-12-11 LAB — COMPREHENSIVE METABOLIC PANEL
ALBUMIN: 3.9 g/dL (ref 3.5–5.2)
ALT: 16 U/L (ref 0–53)
AST: 17 U/L (ref 0–37)
Alkaline Phosphatase: 80 U/L (ref 39–117)
BUN: 20 mg/dL (ref 6–23)
CALCIUM: 9.4 mg/dL (ref 8.4–10.5)
CHLORIDE: 105 meq/L (ref 96–112)
CO2: 28 meq/L (ref 19–32)
Creat: 1.04 mg/dL (ref 0.50–1.35)
Glucose, Bld: 95 mg/dL (ref 70–99)
Potassium: 4.8 mEq/L (ref 3.5–5.3)
Sodium: 141 mEq/L (ref 135–145)
Total Bilirubin: 0.6 mg/dL (ref 0.2–1.2)
Total Protein: 6.8 g/dL (ref 6.0–8.3)

## 2014-12-11 LAB — URIC ACID: Uric Acid, Serum: 8 mg/dL — ABNORMAL HIGH (ref 4.0–7.8)

## 2014-12-11 MED ORDER — COLCHICINE 0.6 MG PO TABS: ORAL_TABLET | ORAL | Status: DC

## 2014-12-11 MED ORDER — COLCHICINE 0.6 MG PO TABS: 0.6000 mg | ORAL_TABLET | Freq: Two times a day (BID) | ORAL | Status: DC

## 2014-12-11 NOTE — Progress Notes (Signed)
OFFICE NOTE  Chief Complaint:  Hospital follow-up  Primary Care Physician: Desmond Dike, MD  HPI:  CURTISS TRONE is a 74 y.o. male with a history of coronary artery bypass grafting in 2003. In 2012 and non-ST elevation myocardial infarction and catheterization at that time showed patent grafts with small vessel disease and distal disease of native vessels. The ejection fraction that time was 45%. The patient has been doing well on medical therapy with no complaints of angina. He states he is quite active does some hiking and biking fairly regularly without any concerns. He currently denies nausea, vomiting, fever, chest pain, shortness of breath, orthopnea, dizziness, PND, cough, congestion, abdominal pain, hematochezia, melena, lower extremity edema, claudication.  He was previously on Ranexa and isosorbide.   I saw Mr. Erstad back in the office today. He reports that since January he's been developing a dull aching chest pain. The symptoms came on when walking after about a mile and improved a little bit. Recently he's been having symptoms that are lasting more during a walk and eventually has gotten to a point now where he cannot go on longer walks. He called the office and was restarted on his indoor and continues to have chest pain. He's had 2 episodes of chest discomfort in the past 2 days were taken nitroglycerin which provided some relief. We did note that his nitroglycerin as it is somewhat expired however does burn minimally under his tongue. He reported that initially had improvement in his pain however the symptoms came back after about an hour or so. These are occurring now at rest suggesting that this is more significant unstable angina. EKG in the office shows a stable right bundle branch block with no new ischemic changes.  Mr. Chauhan returns today from his recent hospitalization. He was referred in for unstable angina and underwent heart catheterization. This demonstrated the  following:  Procedure Performed:  1. Left Heart Catheterization 2. Selective Coronary Angiography 3. SVG angiography 4. LIMA graft angiography 5. Left ventricular angiogram 6. PTCA/DES x 1 proximal body of SVG to intermediate branch 7. Angioseal right femoral artery  Operator: [name], MD  Indication: 74 yo male with history of 5V CABG in 2003 with most recent cath September 2012 with 4/5 patent grafts (SVG to Diagonal occluded) but noted to have severe disease in native targets with no options for PCI at that time. Recently having chest pain c/w unstable angina.   Procedure Details: The risks, benefits, complications, treatment options, and expected outcomes were discussed with the patient. The patient and/or family concurred with the proposed plan, giving informed consent. The patient was brought to the cath lab after IV hydration was begun and oral premedication was given. The patient was further sedated with Versed and Fentanyl. The right groin was prepped and draped in the usual manner. Using the modified Seldinger access technique, a 5 French sheath was placed in the right femoral artery. Standard diagnostic catheters were used to perform selective coronary angiography. The JR4 catheter was used to engage the RCA, the LIMA graft and all vein grafts. A pigtail catheter was used to perform a left ventricular angiogram. He was found to have severe stenosis in the ostium of the SVG to the intermediate branch. I elected to proceed to PCI of this vein graft.   PCI Note: He was given 600 mg Plavix po x 1. Angiomax weight based bolus given and drip started. When the ACT was over 200, I engaged the SVG to  the intermediate branch with a 6 French LCB guiding catheter. I then passed a Cougar IC wire down the SVG into the target intermediate branch. I did not use a distal protection device given the short distance of the graft to the target. I also needed to  make guide manipulations with the ostial location of the stenosis and did not want to have frequent drift of the distal protection device. A 2.5 x 12 mm balloon was used to pre-dilate the ostial stenosis x 1. I then deployed a 3.0 x 16 mm Promus Premier DES in the ostium of the SVG. The stent was post-dilated x 1 with a 3.25 x 12 mm [place] balloon x 1. The stenosis was taken from 99% down to 10%. He was given 24 mcg IC adenosine. There was excellent flow into the target vessel. Angioseal placed right femoral artery.   There were no immediate complications. The patient was taken to the recovery area in stable condition.   Hemodynamic Findings: Central aortic pressure: 118/58 Left ventricular pressure: 119/6/20  Angiographic Findings:  Left main: 10% ostial stenosis.   Left Anterior Descending Artery: Moderate caliber vessel that courses to the apex. The proximal vessel has diffuse 60-70% stenosis supplying a small diagonal branch. The mid and distal vessel fills antegrade and from the patent IMA graft.   Circumflex Artery: 100% ostial occlusion. The intermediate branch fills from the patent vein graft. The obtuse marginal branch fills from right to left collaterals (through filling of the SVG to the PDA).   Right Coronary Artery: 100% mid occlusion. The PDA and PLA fill from the patent vein graft. There is severe stenosis in the native PDA retrograde from the graft insertion but this is unchanged from last cath in 2012. This leads to the PLA.   Graft Anatomy:  SVG to Diagonal is occluded SVG sequential to intermediate branch and obtuse marginal is patent to the intermediate but occluded distal limb to the obtuse marginal.  SVG to PDA is patent with 50% anastomosis lesion into the PDA (unchanged from last cath) and as noted above, there is severe disease in the PDA in the more proximal portion of the vessel than where the graft inserts. This supplies the PLA retrograde and is unchanged from last  cath. (Unfavorable target for PCI).  LIMA graft to mid LAD is patent. 40% ostial stenosis IMA, unchanged from last cath.   Left Ventricular Angiogram: LVEF=40% with anterior wall hypokinesis. Mild MR.   Impression: 1. 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  2. 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 Mild to moderate LV systolic dysfunction 4. Successful PTCA/DES x 1 ostium of SVG to intermediate branch.   Recommendations: He will need dual anti-platelet therapy with ASA and Plavix for at least one year.    Complications: None. The patient tolerated the procedure well.  Since his percutaneous intervention to the vein graft to the intermediate branch she is doing much better. He denies any further angina. Unfortunately, his main complaint today is that he has pain in the first MTP joint of the right foot with redness and swelling as well as warmth. This has been going on for couple days and is difficult for him to ambulate. He says he's had some episode like this about a year ago in the left foot. He denies any prior diagnosis of gout. He does carry a history of rheumatoid arthritis, the details of which are not totally clear to me. I'm  not sure if he is followed up by a rheumatologist.  PMHx:  Past Medical History  Diagnosis Date  . CAD (coronary artery disease)     post bypass in 2003  . Dyslipidemia   . Hypertension   . Complication of anesthesia   . RA (rheumatoid arthritis)   . GERD (gastroesophageal reflux disease)     Past Surgical History  Procedure Laterality Date  . Coronary artery bypass graft  01/2002    EF 45%  . Cardiac catheterization  2012    showed patent grafts with small- vessel disease and distal disease of the native vessels. 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 Coronary angioplasty with stent placement  11/30/2014    PTCA/DES OSLIUM  OF SVG  . Cardiac catheterization  11/30/2014    Procedure: LEFT HEART CATH AND CORS/GRAFTS ANGIOGRAPHY;  Surgeon: Kathleene Hazel, MD;  Location: [facility] CATH LAB;  Service: Cardiovascular;;  . Cardiac catheterization  11/30/2014    Procedure: CORONARY STENT INTERVENTION;  Surgeon: Kathleene Hazel, MD;  Location: [facility] CATH LAB;  Service: Cardiovascular;;  SVG to RI    FAMHx:  Family History  Problem Relation Age of Onset  . Sudden death Mother 41  . Liver disease Maternal Grandmother   . Anuerysm Maternal Grandfather   . Stroke Paternal Grandmother   . Heart attack Paternal Grandfather     SOCHx:   reports that he has never smoked. He has never used smokeless tobacco. He reports that he does not drink alcohol or use illicit drugs.  ALLERGIES:  Allergies  Allergen Reactions  . Ranexa [Ranolazine] Other (See Comments)    [place]    ROS: A comprehensive review of systems was negative except for: Musculoskeletal: positive for Right MTP joint pain  HOME MEDS: Current Outpatient Prescriptions  Medication Sig Dispense Refill  . aspirin 81 MG tablet Take 81 mg by mouth daily.    Marland Kitchen atorvastatin (LIPITOR) 40 MG tablet Take 40 mg by mouth daily.    . clopidogrel (PLAVIX) 75 MG tablet Take 1 tablet (75 mg total) by mouth daily with breakfast. 30 tablet 11  . isosorbide mononitrate (IMDUR) 60 MG 24 hr tablet Take 1 tablet (60 mg total) by mouth daily. 30 tablet 1  . losartan (COZAAR) 25 MG tablet Take 1 tablet (25 mg total) by mouth daily. 30 tablet 11  . metoprolol tartrate (LOPRESSOR) 25 MG tablet Take 0.5 tablets (12.5 mg total) by mouth 2 (two) times daily. 30 tablet 10  . nitroGLYCERIN (NITROSTAT) 0.4 MG SL tablet Place 1 tablet (0.4 mg total) under the tongue every 5 (five) minutes as needed for chest pain. 25 tablet 3  . pantoprazole  (PROTONIX) 20 MG tablet Take 1 tablet (20 mg total) by mouth daily. 30 tablet 11  . colchicine 0.6 MG tablet Take 2 tablets by mouth, then take 1 tablet one hour later. Take by mouth as directed. 20 tablet 0   No current facility-administered medications for this visit.    LABS/IMAGING: No results found for this or any previous visit (from the past 48 hour(s)). No results found.  VITALS: BP 132/80 mmHg  Pulse 50  Ht  (1.88 m)  Wt 207 lb 3.2 oz (93.985 kg)  BMI 26.59 kg/m2  EXAM: General appearance: alert and no distress Neck: no carotid bruit and no JVD Lungs: clear to auscultation bilaterally Heart: regular rate and rhythm, S1, S2 normal, no murmur, click, rub or gallop Abdomen: soft, non-tender; bowel sounds normal; no masses,  no organomegaly Extremities: Swelling and warmth with redness and tenderness to palpation of the right MTP joint Pulses: 2+ and symmetric Skin: Skin color, texture, turgor normal. No rashes or lesions Neurologic: Grossly normal Psych: Mood, affect normal  EKG: Deferred  ASSESSMENT:  1. Podagra 2. Unstable angina - status post PCI to the SVG to intermediate branch with a Promus Premier DES 3. CAD s/p CABG x 5 in 2003 4. Dyslipidemia 5. Ischemic cardioymyopathy, EF 45% 6. HTN  PLAN: 1.   Mr. Gupton is symptomatically improved with his coronary disease. His main complaint today is pain in the right MTP joint which is consistent with podagra, however could be a rheumatoid flare. He does not have any symptoms in the joints of the left foot or any other particular symptoms concerning for an RA flare. This seems to be more consistent with gout. I would like to be treated with colchicine 1.2 mg 1 followed by an additional 0.6 mg dose after one hour. We will go ahead and check lab work today including a conference of metabolic profile and uric acid level. If his uric acid level remains elevated I would defer treatment to his primary care provider or if  he has a rheumatologist to consider putting him on allopurinol or uloric.  Follow-up in 6 months.  Chrystie Nose, MD, [facility] Attending Cardiologist [facility]  HILTY,Kenneth C 12/11/2014, 9:18 AM

## 2014-12-11 NOTE — Patient Instructions (Addendum)
Your physician has recommended you make the following change in your medication (for gout) 1. TAKE colchicine 0.6mg  x 2 TABLETS TODAY then take ONE TABLET 1 hour later   >> Dr. Rennis Golden recommends that you follow up with your primary care provider for this.   Your physician wants you to follow-up in: 6 months with Dr. Rennis Golden. You will receive a reminder letter in the mail two months in advance. If you don't receive a letter, please call our office to schedule the follow-up appointment.

## 2015-01-11 ENCOUNTER — Ambulatory Visit: Payer: Medicare PPO | Admitting: Internal Medicine

## 2015-01-28 ENCOUNTER — Other Ambulatory Visit: Payer: Self-pay

## 2015-01-28 MED ORDER — ISOSORBIDE MONONITRATE ER 60 MG PO TB24: 60.0000 mg | ORAL_TABLET | Freq: Every day | ORAL | Status: DC

## 2015-01-28 NOTE — Telephone Encounter (Signed)
Rx(s) sent to pharmacy electronically.  

## 2015-04-19 ENCOUNTER — Telehealth: Payer: Self-pay | Admitting: Internal Medicine

## 2015-04-22 NOTE — Telephone Encounter (Signed)
Close encounter 

## 2015-06-03 ENCOUNTER — Telehealth: Payer: Self-pay | Admitting: Internal Medicine

## 2015-06-03 ENCOUNTER — Encounter: Payer: Self-pay | Admitting: Internal Medicine

## 2015-06-07 NOTE — Telephone Encounter (Signed)
Close encounter 

## 2015-06-18 ENCOUNTER — Ambulatory Visit: Payer: Medicare PPO | Admitting: Internal Medicine

## 2015-07-24 ENCOUNTER — Ambulatory Visit (INDEPENDENT_AMBULATORY_CARE_PROVIDER_SITE_OTHER): Payer: Medicare PPO | Admitting: Internal Medicine

## 2015-07-24 ENCOUNTER — Encounter: Payer: Self-pay | Admitting: Internal Medicine

## 2015-07-24 VITALS — BP 142/78 | HR 65 | Ht 74.0 in | Wt 212.0 lb

## 2015-07-24 DIAGNOSIS — I4891 Unspecified atrial fibrillation: Secondary | ICD-10-CM

## 2015-07-24 DIAGNOSIS — Z951 Presence of aortocoronary bypass graft: Secondary | ICD-10-CM | POA: Diagnosis not present

## 2015-07-24 DIAGNOSIS — M109 Gout, unspecified: Secondary | ICD-10-CM | POA: Insufficient documentation

## 2015-07-24 DIAGNOSIS — Z79899 Other long term (current) drug therapy: Secondary | ICD-10-CM | POA: Diagnosis not present

## 2015-07-24 DIAGNOSIS — I255 Ischemic cardiomyopathy: Secondary | ICD-10-CM

## 2015-07-24 DIAGNOSIS — K219 Gastro-esophageal reflux disease without esophagitis: Secondary | ICD-10-CM | POA: Diagnosis not present

## 2015-07-24 DIAGNOSIS — M1000 Idiopathic gout, unspecified site: Secondary | ICD-10-CM

## 2015-07-24 LAB — BASIC METABOLIC PANEL
BUN: 17 mg/dL (ref 7–25)
CO2: 27 mmol/L (ref 20–31)
Calcium: 9.8 mg/dL (ref 8.6–10.3)
Chloride: 103 mmol/L (ref 98–110)
Creat: 1.07 mg/dL (ref 0.70–1.18)
GLUCOSE: 94 mg/dL (ref 65–99)
POTASSIUM: 4.6 mmol/L (ref 3.5–5.3)
SODIUM: 141 mmol/L (ref 135–146)

## 2015-07-24 MED ORDER — RIVAROXABAN 20 MG PO TABS: 20.0000 mg | ORAL_TABLET | Freq: Every day | ORAL | Status: DC

## 2015-07-24 MED ORDER — ALLOPURINOL 300 MG PO TABS: 300.0000 mg | ORAL_TABLET | Freq: Every day | ORAL | Status: DC

## 2015-07-24 NOTE — Progress Notes (Signed)
OFFICE NOTE  Chief Complaint:  Follow-up gout  Primary Care Physician: Desmond Dike, MD  HPI:  NEITHAN DAY is a 74 y.o. male with a history of coronary artery bypass grafting in 2003. In 2012 and non-ST elevation myocardial infarction and catheterization at that time showed patent grafts with small vessel disease and distal disease of native vessels. The ejection fraction that time was 45%. The patient has been doing well on medical therapy with no complaints of angina. He states he is quite active does some hiking and biking fairly regularly without any concerns. He currently denies nausea, vomiting, fever, chest pain, shortness of breath, orthopnea, dizziness, PND, cough, congestion, abdominal pain, hematochezia, melena, lower extremity edema, claudication.  He was previously on Ranexa and isosorbide.   I saw Mr. Caples back in the office today. He reports that since January he's been developing a dull aching chest pain. The symptoms came on when walking after about a mile and improved a little bit. Recently he's been having symptoms that are lasting more during a walk and eventually has gotten to a point now where he cannot go on longer walks. He called the office and was restarted on his indoor and continues to have chest pain. He's had 2 episodes of chest discomfort in the past 2 days were taken nitroglycerin which provided some relief. We did note that his nitroglycerin as it is somewhat expired however does burn minimally under his tongue. He reported that initially had improvement in his pain however the symptoms came back after about an hour or so. These are occurring now at rest suggesting that this is more significant unstable angina. EKG in the office shows a stable right bundle branch block with no new ischemic changes.  Mr. Cominsky returns today from his recent hospitalization. He was referred in for unstable angina and underwent heart catheterization. This demonstrated the  following:  Procedure Performed:  1. Left Heart Catheterization 2. Selective Coronary Angiography 3. SVG angiography 4. LIMA graft angiography 5. Left ventricular angiogram 6. PTCA/DES x 1 proximal body of SVG to intermediate branch 7. Angioseal right femoral artery  Operator: [name], MD  Indication: 73 yo male with history of 5V CABG in 2003 with most recent cath September 2012 with 4/5 patent grafts (SVG to Diagonal occluded) but noted to have severe disease in native targets with no options for PCI at that time. Recently having chest pain c/w unstable angina.   Procedure Details: The risks, benefits, complications, treatment options, and expected outcomes were discussed with the patient. The patient and/or family concurred with the proposed plan, giving informed consent. The patient was brought to the cath lab after IV hydration was begun and oral premedication was given. The patient was further sedated with Versed and Fentanyl. The right groin was prepped and draped in the usual manner. Using the modified Seldinger access technique, a 5 French sheath was placed in the right femoral artery. Standard diagnostic catheters were used to perform selective coronary angiography. The JR4 catheter was used to engage the RCA, the LIMA graft and all vein grafts. A pigtail catheter was used to perform a left ventricular angiogram. He was found to have severe stenosis in the ostium of the SVG to the intermediate branch. I elected to proceed to PCI of this vein graft.   PCI Note: He was given 600 mg Plavix po x 1. Angiomax weight based bolus given and drip started. When the ACT was over 200, I engaged the SVG to  the intermediate branch with a 6 French LCB guiding catheter. I then passed a Cougar IC wire down the SVG into the target intermediate branch. I did not use a distal protection device given the short distance of the graft to the target. I also needed to  make guide manipulations with the ostial location of the stenosis and did not want to have frequent drift of the distal protection device. A 2.5 x 12 mm balloon was used to pre-dilate the ostial stenosis x 1. I then deployed a 3.0 x 16 mm Promus Premier DES in the ostium of the SVG. The stent was post-dilated x 1 with a 3.25 x 12 mm  balloon x 1. The stenosis was taken from 99% down to 10%. He was given 24 mcg IC adenosine. There was excellent flow into the target vessel. Angioseal placed right femoral artery.   There were no immediate complications. The patient was taken to the recovery area in stable condition.   Hemodynamic Findings: Central aortic pressure: 118/58 Left ventricular pressure: 119/6/20  Angiographic Findings:  Left main: 10% ostial stenosis.   Left Anterior Descending Artery: Moderate caliber vessel that courses to the apex. The proximal vessel has diffuse 60-70% stenosis supplying a small diagonal branch. The mid and distal vessel fills antegrade and from the patent IMA graft.   Circumflex Artery: 100% ostial occlusion. The intermediate branch fills from the patent vein graft. The obtuse marginal branch fills from right to left collaterals (through filling of the SVG to the PDA).   Right Coronary Artery: 100% mid occlusion. The PDA and PLA fill from the patent vein graft. There is severe stenosis in the native PDA retrograde from the graft insertion but this is unchanged from last cath in 2012. This leads to the PLA.   Graft Anatomy:  SVG to Diagonal is occluded SVG sequential to intermediate branch and obtuse marginal is patent to the intermediate but occluded distal limb to the obtuse marginal.  SVG to PDA is patent with 50% anastomosis lesion into the PDA (unchanged from last cath) and as noted above, there is severe disease in the PDA in the more proximal portion of the vessel than where the graft inserts. This supplies the PLA retrograde and is unchanged from last  cath. (Unfavorable target for PCI).  LIMA graft to mid LAD is patent. 40% ostial stenosis IMA, unchanged from last cath.   Left Ventricular Angiogram: LVEF=40% with anterior wall hypokinesis. Mild MR.   Impression: 1. 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  2. 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 Mild to moderate LV systolic dysfunction 4. Successful PTCA/DES x 1 ostium of SVG to intermediate branch.   Recommendations: He will need dual anti-platelet therapy with ASA and Plavix for at least one year.    Complications: None. The patient tolerated the procedure well.  Since his percutaneous intervention to the vein graft to the intermediate branch she is doing much better. He denies any further angina. Unfortunately, his main complaint today is that he has pain in the first MTP joint of the right foot with redness and swelling as well as warmth. This has been going on for couple days and is difficult for him to ambulate. He says he's had some episode like this about a year ago in the left foot. He denies any prior diagnosis of gout. He does carry a history of rheumatoid arthritis, the details of which are not totally clear to me. I'm  not sure if he is followed up by a rheumatologist.  Mr. Kunath returns today for follow-up. He reports that his gout has resolved. His uric acid level was elevated at 8.0 however he was not back to see his primary care provider for treatment. Unfortunately, an EKG in the office today shows either atypical atrial flutter or coarse atrial fibrillation. This is a new diagnosis for him. I showed him the EKGs and explained atrial fibrillation in great detail including the increased risk for stroke. He seemed to have a difficult time understanding the need to be on anticoagulation. We did discuss the elevated bleeding risk that he may face since he's currently on aspirin and Plavix for a stent that was placed in February.  He seems to be asymptomatic although recently has had some more fatigue which could be attributable to this. He denies any chest pain.   PMHx:  Past Medical History  Diagnosis Date  . CAD (coronary artery disease)     post bypass in 2003  . Dyslipidemia   . Hypertension   . Complication of anesthesia   . RA (rheumatoid arthritis)   . GERD (gastroesophageal reflux disease)     Past Surgical History  Procedure Laterality Date  . Coronary artery bypass graft  01/2002    EF 45%  . Cardiac catheterization  2012    showed patent grafts with small- vessel disease and distal disease of the native vessels. 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 Coronary angioplasty with stent placement  11/30/2014    PTCA/DES OSLIUM  OF SVG  . Cardiac catheterization  11/30/2014    Procedure: LEFT HEART CATH AND CORS/GRAFTS ANGIOGRAPHY;  Surgeon: Kathleene Hazel, MD;  Location: [facility] CATH LAB;  Service: Cardiovascular;;  . Cardiac catheterization  11/30/2014    Procedure: CORONARY STENT INTERVENTION;  Surgeon: Kathleene Hazel, MD;  Location: [facility] CATH LAB;  Service: Cardiovascular;;  SVG to RI    FAMHx:  Family History  Problem Relation Age of Onset  . Sudden death Mother 52  . Liver disease Maternal Grandmother   . Anuerysm Maternal Grandfather   . Stroke Paternal Grandmother   . Heart attack Paternal Grandfather     SOCHx:   reports that he has never smoked. He has never used smokeless tobacco. He reports that he does not drink alcohol or use illicit drugs.  ALLERGIES:  Allergies  Allergen Reactions  . Ranexa [Ranolazine] Other (See Comments)    [place]    ROS: A comprehensive review of systems was negative except for: Constitutional: positive for fatigue  HOME MEDS: Current  Outpatient Prescriptions  Medication Sig Dispense Refill  . atorvastatin (LIPITOR) 40 MG tablet Take 40 mg by mouth daily.    . clopidogrel (PLAVIX) 75 MG tablet Take 1 tablet (75 mg total) by mouth daily with breakfast. 30 tablet 11  . colchicine 0.6 MG tablet Take 2 tablets by mouth, then take 1 tablet one hour later. Take by mouth as directed. 20 tablet 0  . isosorbide mononitrate (IMDUR) 60 MG 24 hr tablet Take 1 tablet (60 mg total) by mouth daily. 30 tablet 10  . losartan (COZAAR) 25 MG tablet Take 1 tablet (25 mg total) by mouth daily. 30 tablet 11  . metoprolol tartrate (  LOPRESSOR) 25 MG tablet Take 0.5 tablets (12.5 mg total) by mouth 2 (two) times daily. 30 tablet 10  . nitroGLYCERIN (NITROSTAT) 0.4 MG SL tablet Place 1 tablet (0.4 mg total) under the tongue every 5 (five) minutes as needed for chest pain. 25 tablet 3  . pantoprazole (PROTONIX) 20 MG tablet Take 1 tablet (20 mg total) by mouth daily. 30 tablet 11  . allopurinol (ZYLOPRIM) 300 MG tablet Take 1 tablet (300 mg total) by mouth daily. 30 tablet 6  . rivaroxaban (XARELTO) 20 MG TABS tablet Take 1 tablet (20 mg total) by mouth daily with supper. 30 tablet 6   No current facility-administered medications for this visit.    LABS/IMAGING: No results found for this or any previous visit (from the past 48 hour(s)). No results found.  VITALS: BP 142/78 mmHg  Pulse 65  Ht 6\' 2"  (1.88 m)  Wt 212 lb (96.163 kg)  BMI 27.21 kg/m2  EXAM: General appearance: alert and no distress Neck: no carotid bruit and no JVD Lungs: clear to auscultation bilaterally Heart: irregularly irregular rhythm Abdomen: soft, non-tender; bowel sounds normal; no masses,  no organomegaly Extremities: extremities normal, atraumatic, no cyanosis or edema Pulses: 2+ and symmetric Skin: Skin color, texture, turgor normal. No rashes or lesions Neurologic: Grossly normal Psych: Mood, affect normal  EKG: Atrial flutter or coarse atrial fibrillation at  65, right bundle branch block  ASSESSMENT:  1. 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 Unstable angina - status post PCI to the SVG to intermediate branch with a Promus Premier DES (11/2014) 4. CAD s/p CABG x 5 in 2003 5. Dyslipidemia 6. Ischemic cardioymyopathy, EF 45% 7. HTN  PLAN: 1.   Mr. Bowersock is a new onset atrial fibrillation or atypical atrial flutter which is rate controlled. He may be a little more fatigue with this but I don't think is really aware that he has it. EF has been reduced in the past at 45% and may be lower as a result of this abnormal rhythm. We talked about his increased risk of stroke, and I suggest that his CHADSVASC score is 3. I recommend discontinuing aspirin and continuing Plavix as well as starting Xarelto 20 mg daily. Renal function was normal in February and we'll go ahead and recheck that today. Plan to see him back in one month at which time will recheck an EKG. If he persists in atrial fibrillation/flutter we will need to consider elective cardioversion. I also asked Belenda Cruise, our anticoagulation pharmacist, to talk with him about reticulate stress that he needs to take it with a full meal during the day.  Plan to see him back in one month.  Chrystie Nose, MD, [facility] Attending Cardiologist [facility]  Lisette Abu [facility] 07/24/2015, 5:05 PM

## 2015-07-24 NOTE — Patient Instructions (Addendum)
Medication Instructions:   STOP aspirin  START xarelto 20mg  once daily with a big meal  >> THIS IS VERY IMPORTANT THAT YOU TAKE THIS MEDICATION - THIS THINS YOUR BLOOD TO PREVENT CLOTS  START allopurinol 300mg  once daily - for gout  Labwork:  TODAY - first floor of this building - [address]  >> take elevator down to floor one >> 1st hallway on right >> take a left, [address] is on left  Follow-Up:  1 month with Dr.  Any Other Special Instructions Will Be Listed Below (If Applicable).

## 2015-08-22 ENCOUNTER — Encounter: Payer: Self-pay | Admitting: Internal Medicine

## 2015-08-22 ENCOUNTER — Ambulatory Visit (INDEPENDENT_AMBULATORY_CARE_PROVIDER_SITE_OTHER): Payer: Medicare PPO | Admitting: Internal Medicine

## 2015-08-22 VITALS — BP 130/72 | HR 58 | Ht 74.0 in | Wt 214.0 lb

## 2015-08-22 DIAGNOSIS — I480 Paroxysmal atrial fibrillation: Secondary | ICD-10-CM | POA: Insufficient documentation

## 2015-08-22 DIAGNOSIS — Z951 Presence of aortocoronary bypass graft: Secondary | ICD-10-CM | POA: Diagnosis not present

## 2015-08-22 DIAGNOSIS — I255 Ischemic cardiomyopathy: Secondary | ICD-10-CM

## 2015-08-22 MED ORDER — METOPROLOL TARTRATE 25 MG PO TABS: 12.5000 mg | ORAL_TABLET | Freq: Two times a day (BID) | ORAL | Status: DC

## 2015-08-22 MED ORDER — PANTOPRAZOLE SODIUM 20 MG PO TBEC: 20.0000 mg | DELAYED_RELEASE_TABLET | Freq: Every day | ORAL | Status: DC

## 2015-08-22 MED ORDER — ATORVASTATIN CALCIUM 40 MG PO TABS: 40.0000 mg | ORAL_TABLET | Freq: Every day | ORAL | Status: DC

## 2015-08-22 MED ORDER — ISOSORBIDE MONONITRATE ER 60 MG PO TB24: 60.0000 mg | ORAL_TABLET | Freq: Every day | ORAL | Status: DC

## 2015-08-22 MED ORDER — CLOPIDOGREL BISULFATE 75 MG PO TABS: 75.0000 mg | ORAL_TABLET | Freq: Every day | ORAL | Status: DC

## 2015-08-22 NOTE — Patient Instructions (Signed)
Your physician wants you to follow-up in: 6 months with Dr. Hilty. You will receive a reminder letter in the mail two months in advance. If you don't receive a letter, please call our office to schedule the follow-up appointment.    

## 2015-08-22 NOTE — Progress Notes (Signed)
OFFICE NOTE  Chief Complaint:  Follow-up a-fib  Primary Care Physician: Desmond Dike, MD  HPI:  Earl Keller is a 74 y.o. male with a history of coronary artery bypass grafting in 2003. In 2012 and non-ST elevation myocardial infarction and catheterization at that time showed patent grafts with small vessel disease and distal disease of native vessels. The ejection fraction that time was 45%. The patient has been doing well on medical therapy with no complaints of angina. He states he is quite active does some hiking and biking fairly regularly without any concerns. He currently denies nausea, vomiting, fever, chest pain, shortness of breath, orthopnea, dizziness, PND, cough, congestion, abdominal pain, hematochezia, melena, lower extremity edema, claudication.  He was previously on Ranexa and isosorbide.   I saw Earl Keller back in the office today. He reports that since January he's been developing a dull aching chest pain. The symptoms came on when walking after about a mile and improved a little bit. Recently he's been having symptoms that are lasting more during a walk and eventually has gotten to a point now where he cannot go on longer walks. He called the office and was restarted on his indoor and continues to have chest pain. He's had 2 episodes of chest discomfort in the past 2 days were taken nitroglycerin which provided some relief. We did note that his nitroglycerin as it is somewhat expired however does burn minimally under his tongue. He reported that initially had improvement in his pain however the symptoms came back after about an hour or so. These are occurring now at rest suggesting that this is more significant unstable angina. EKG in the office shows a stable right bundle branch block with no new ischemic changes.  Earl Keller returns today from his recent hospitalization. He was referred in for unstable angina and underwent heart catheterization. This demonstrated the  following:  Procedure Performed:  1. Left Heart Catheterization 2. Selective Coronary Angiography 3. SVG angiography 4. LIMA graft angiography 5. Left ventricular angiogram 6. PTCA/DES x 1 proximal body of SVG to intermediate branch 7. Angioseal right femoral artery  Operator: [name], MD  Indication: 74 yo male with history of 5V CABG in 2003 with most recent cath September 2012 with 4/5 patent grafts (SVG to Diagonal occluded) but noted to have severe disease in native targets with no options for PCI at that time. Recently having chest pain c/w unstable angina.   Procedure Details: The risks, benefits, complications, treatment options, and expected outcomes were discussed with the patient. The patient and/or family concurred with the proposed plan, giving informed consent. The patient was brought to the cath lab after IV hydration was begun and oral premedication was given. The patient was further sedated with Versed and Fentanyl. The right groin was prepped and draped in the usual manner. Using the modified Seldinger access technique, a 5 French sheath was placed in the right femoral artery. Standard diagnostic catheters were used to perform selective coronary angiography. The JR4 catheter was used to engage the RCA, the LIMA graft and all vein grafts. A pigtail catheter was used to perform a left ventricular angiogram. He was found to have severe stenosis in the ostium of the SVG to the intermediate branch. I elected to proceed to PCI of this vein graft.   PCI Note: He was given 600 mg Plavix po x 1. Angiomax weight based bolus given and drip started. When the ACT was over 200, I engaged the SVG to  the intermediate branch with a 6 French LCB guiding catheter. I then passed a Cougar IC wire down the SVG into the target intermediate branch. I did not use a distal protection device given the short distance of the graft to the target. I also needed to  make guide manipulations with the ostial location of the stenosis and did not want to have frequent drift of the distal protection device. A 2.5 x 12 mm balloon was used to pre-dilate the ostial stenosis x 1. I then deployed a 3.0 x 16 mm Promus Premier DES in the ostium of the SVG. The stent was post-dilated x 1 with a 3.25 x 12 mm [place] balloon x 1. The stenosis was taken from 99% down to 10%. He was given 24 mcg IC adenosine. There was excellent flow into the target vessel. Angioseal placed right femoral artery.   There were no immediate complications. The patient was taken to the recovery area in stable condition.   Hemodynamic Findings: Central aortic pressure: 118/58 Left ventricular pressure: 119/6/20  Angiographic Findings:  Left main: 10% ostial stenosis.   Left Anterior Descending Artery: Moderate caliber vessel that courses to the apex. The proximal vessel has diffuse 60-70% stenosis supplying a small diagonal branch. The mid and distal vessel fills antegrade and from the patent IMA graft.   Circumflex Artery: 100% ostial occlusion. The intermediate branch fills from the patent vein graft. The obtuse marginal branch fills from right to left collaterals (through filling of the SVG to the PDA).   Right Coronary Artery: 100% mid occlusion. The PDA and PLA fill from the patent vein graft. There is severe stenosis in the native PDA retrograde from the graft insertion but this is unchanged from last cath in 2012. This leads to the PLA.   Graft Anatomy:  SVG to Diagonal is occluded SVG sequential to intermediate branch and obtuse marginal is patent to the intermediate but occluded distal limb to the obtuse marginal.  SVG to PDA is patent with 50% anastomosis lesion into the PDA (unchanged from last cath) and as noted above, there is severe disease in the PDA in the more proximal portion of the vessel than where the graft inserts. This supplies the PLA retrograde and is unchanged from last  cath. (Unfavorable target for PCI).  LIMA graft to mid LAD is patent. 40% ostial stenosis IMA, unchanged from last cath.   Left Ventricular Angiogram: LVEF=40% with anterior wall hypokinesis. Mild MR.   Impression: 1. 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  2. 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 Mild to moderate LV systolic dysfunction 4. Successful PTCA/DES x 1 ostium of SVG to intermediate branch.   Recommendations: He will need dual anti-platelet therapy with ASA and Plavix for at least one year.    Complications: None. The patient tolerated the procedure well.  Since his percutaneous intervention to the vein graft to the intermediate branch she is doing much better. He denies any further angina. Unfortunately, his main complaint today is that he has pain in the first MTP joint of the right foot with redness and swelling as well as warmth. This has been going on for couple days and is difficult for him to ambulate. He says he's had some episode like this about a year ago in the left foot. He denies any prior diagnosis of gout. He does carry a history of rheumatoid arthritis, the details of which are not totally clear to me. I'm  not sure if he is followed up by a rheumatologist.  Earl Keller returns today for follow-up. He reports that his gout has resolved. His uric acid level was elevated at 8.0 however he was not back to see his primary care provider for treatment. Unfortunately, an EKG in the office today shows either atypical atrial flutter or coarse atrial fibrillation. This is a new diagnosis for him. I showed him the EKGs and explained atrial fibrillation in great detail including the increased risk for stroke. He seemed to have a difficult time understanding the need to be on anticoagulation. We did discuss the elevated bleeding risk that he may face since he's currently on aspirin and Plavix for a stent that was placed in February.  He seems to be asymptomatic although recently has had some more fatigue which could be attributable to this. He denies any chest pain.   I saw Earl Keller back today in the office. Fortunately he has converted back to sinus rhythm. 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 Unfortunately he did not start the blood thinner. He told me he has a strong family history of bleeding and his father almost bled to death on warfarin. He is also had some GI bleeding and is very concerned about the medication. He understands that his CHADSVASC score is 3, therefore increasing his wrist to about 5% annually for stroke. Despite these warnings he is adamant against taking a blood thinner. He understands the stroke could be physically disabling and even lead to death.  PMHx:  Past Medical History  Diagnosis Date  . CAD (coronary artery disease)     post bypass in 2003  . Dyslipidemia   . Hypertension   . Complication of anesthesia   . RA (rheumatoid arthritis) (HCC)   . GERD (gastroesophageal reflux disease)     Past Surgical History  Procedure Laterality Date  . Coronary artery bypass graft  01/2002    EF 45%  . Cardiac catheterization  2012    showed patent grafts with small- vessel disease and distal disease of the native vessels. 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 Coronary angioplasty with stent placement  11/30/2014    PTCA/DES OSLIUM  OF SVG  . Cardiac catheterization  11/30/2014    Procedure: LEFT HEART CATH AND CORS/GRAFTS ANGIOGRAPHY;  Surgeon: Kathleene Hazel, MD;  Location: [facility] CATH LAB;  Service: Cardiovascular;;  . Cardiac catheterization  11/30/2014    Procedure: CORONARY STENT INTERVENTION;  Surgeon: Kathleene Hazel, MD;  Location: [facility] CATH LAB;  Service: Cardiovascular;;  SVG to  RI    FAMHx:  Family History  Problem Relation Age of Onset  . Sudden death Mother 36  . Liver disease Maternal Grandmother   . Anuerysm Maternal Grandfather   . Stroke Paternal Grandmother   . Heart attack Paternal Grandfather     SOCHx:   reports that he has never smoked. He has never used smokeless tobacco. He reports that he does not drink alcohol or use illicit drugs.  ALLERGIES:  Allergies  Allergen Reactions  . Ranexa [Ranolazine] Other (See Comments)    [place]    ROS: A comprehensive review of systems was negative.  HOME MEDS: Current Outpatient Prescriptions  Medication Sig Dispense Refill  . allopurinol (ZYLOPRIM)  300 MG tablet Take 1 tablet (300 mg total) by mouth daily. 30 tablet 6  . atorvastatin (LIPITOR) 40 MG tablet Take 40 mg by mouth daily.    . clopidogrel (PLAVIX) 75 MG tablet Take 1 tablet (75 mg total) by mouth daily with breakfast. 30 tablet 11  . colchicine 0.6 MG tablet Take 2 tablets by mouth, then take 1 tablet one hour later. Take by mouth as directed. 20 tablet 0  . isosorbide mononitrate (IMDUR) 60 MG 24 hr tablet Take 1 tablet (60 mg total) by mouth daily. 30 tablet 10  . losartan (COZAAR) 25 MG tablet Take 1 tablet (25 mg total) by mouth daily. 30 tablet 11  . metoprolol tartrate (LOPRESSOR) 25 MG tablet Take 0.5 tablets (12.5 mg total) by mouth 2 (two) times daily. 30 tablet 10  . nitroGLYCERIN (NITROSTAT) 0.4 MG SL tablet Place 1 tablet (0.4 mg total) under the tongue every 5 (five) minutes as needed for chest pain. 25 tablet 3  . pantoprazole (PROTONIX) 20 MG tablet Take 1 tablet (20 mg total) by mouth daily. 30 tablet 11   No current facility-administered medications for this visit.    LABS/IMAGING: No results found for this or any previous visit (from the past 48 hour(s)). No results found.  VITALS: BP 130/72 mmHg  Pulse 58  Ht 6\' 2"  (1.88 m)  Wt 214 lb (97.07 kg)  BMI 27.46 kg/m2  EXAM: Deferred  EKG: Sinus  bradycardia and first-degree AV block, bifascicular block 58  ASSESSMENT:  1. 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 Unstable angina - status post PCI to the SVG to intermediate branch with a Promus Premier DES (11/2014) 4. CAD s/p CABG x 5 in 2003 5. Dyslipidemia 6. Ischemic cardioymyopathy, EF 45% 7. HTN  PLAN: 1.   Mr. Schlicher has paroxysmal atrial fibrillation and has converted back to sinus rhythm. He will not need cardioversion. He did not start taking Zaroxolyn due to concerns about bleeding. I did talk to him about his risk of stroke which is significant based on a CHADSVASC score of 3. He has had some GI bleeding in the past and is concerned about that risk. We discussed several options but he declines taking additional blood thinners. He prefers to stay on Plavix. He understands the Plavix may not provide adequate risk for stroke. Stroke can be disabling and or lead to death. He is accepting of these possible outcomes. Plan to see him back in 6 months.  Chrystie Nose, MD, [facility] Attending Cardiologist [facility]  Chrystie Nose 08/22/2015, 8:31 AM

## 2015-12-26 ENCOUNTER — Other Ambulatory Visit: Payer: Self-pay | Admitting: *Deleted

## 2015-12-26 ENCOUNTER — Other Ambulatory Visit: Payer: Self-pay | Admitting: Physician Assistant

## 2015-12-26 MED ORDER — LOSARTAN POTASSIUM 25 MG PO TABS: 25.0000 mg | ORAL_TABLET | Freq: Every day | ORAL | Status: DC

## 2016-03-26 ENCOUNTER — Encounter: Payer: Self-pay | Admitting: Internal Medicine

## 2016-03-26 ENCOUNTER — Ambulatory Visit (INDEPENDENT_AMBULATORY_CARE_PROVIDER_SITE_OTHER): Payer: Medicare Other | Admitting: Internal Medicine

## 2016-03-26 VITALS — BP 158/80 | HR 52 | Ht 74.0 in | Wt 211.0 lb

## 2016-03-26 DIAGNOSIS — Z951 Presence of aortocoronary bypass graft: Secondary | ICD-10-CM

## 2016-03-26 DIAGNOSIS — Z79899 Other long term (current) drug therapy: Secondary | ICD-10-CM

## 2016-03-26 DIAGNOSIS — I480 Paroxysmal atrial fibrillation: Secondary | ICD-10-CM | POA: Diagnosis not present

## 2016-03-26 DIAGNOSIS — I255 Ischemic cardiomyopathy: Secondary | ICD-10-CM

## 2016-03-26 DIAGNOSIS — M1000 Idiopathic gout, unspecified site: Secondary | ICD-10-CM

## 2016-03-26 DIAGNOSIS — E785 Hyperlipidemia, unspecified: Secondary | ICD-10-CM | POA: Diagnosis not present

## 2016-03-26 LAB — COMPREHENSIVE METABOLIC PANEL
ALK PHOS: 62 U/L (ref 40–115)
ALT: 20 U/L (ref 9–46)
AST: 21 U/L (ref 10–35)
Albumin: 4.1 g/dL (ref 3.6–5.1)
BUN: 13 mg/dL (ref 7–25)
CO2: 24 mmol/L (ref 20–31)
CREATININE: 1.1 mg/dL (ref 0.70–1.18)
Calcium: 9.3 mg/dL (ref 8.6–10.3)
Chloride: 107 mmol/L (ref 98–110)
GLUCOSE: 101 mg/dL — AB (ref 65–99)
Potassium: 4.6 mmol/L (ref 3.5–5.3)
SODIUM: 141 mmol/L (ref 135–146)
TOTAL PROTEIN: 6.7 g/dL (ref 6.1–8.1)
Total Bilirubin: 0.9 mg/dL (ref 0.2–1.2)

## 2016-03-26 LAB — LIPID PANEL
Cholesterol: 135 mg/dL (ref 125–200)
HDL: 38 mg/dL — ABNORMAL LOW (ref >=40)
LDL CALC: 68 mg/dL (ref <130)
Total CHOL/HDL Ratio: 3.6 Ratio (ref <=5.0)
Triglycerides: 147 mg/dL (ref <150)
VLDL: 29 mg/dL (ref <30)

## 2016-03-26 NOTE — Patient Instructions (Signed)
Your physician has requested that you have an echocardiogram @ [address] - 3rd Floor. Echocardiography is a painless test that uses sound waves to create images of your heart. It provides your doctor with information about the size and shape of your heart and how well your heart's chambers and valves are working. This procedure takes approximately one hour. There are no restrictions for this procedure.  Your physician recommends that you return for lab work TODAY - first floor [address]   Your physician wants you to follow-up in: 6 months with Dr. Rennis Golden. You will receive a reminder letter in the mail two months in advance. If you don't receive a letter, please call our office to schedule the follow-up appointment.

## 2016-03-26 NOTE — Progress Notes (Signed)
OFFICE NOTE  Chief Complaint:  Follow-up  Primary Care Physician: Desmond Dike, MD  HPI:  Earl Keller is a 75 y.o. male with a history of coronary artery bypass grafting in 2003. In 2012 and non-ST elevation myocardial infarction and catheterization at that time showed patent grafts with small vessel disease and distal disease of native vessels. The ejection fraction that time was 45%. The patient has been doing well on medical therapy with no complaints of angina. He states he is quite active does some hiking and biking fairly regularly without any concerns. He currently denies nausea, vomiting, fever, chest pain, shortness of breath, orthopnea, dizziness, PND, cough, congestion, abdominal pain, hematochezia, melena, lower extremity edema, claudication.  He was previously on Ranexa and isosorbide.   I saw Earl Keller back in the office today. He reports that since January he's been developing a dull aching chest pain. The symptoms came on when walking after about a mile and improved a little bit. Recently he's been having symptoms that are lasting more during a walk and eventually has gotten to a point now where he cannot go on longer walks. He called the office and was restarted on his indoor and continues to have chest pain. He's had 2 episodes of chest discomfort in the past 2 days were taken nitroglycerin which provided some relief. We did note that his nitroglycerin as it is somewhat expired however does burn minimally under his tongue. He reported that initially had improvement in his pain however the symptoms came back after about an hour or so. These are occurring now at rest suggesting that this is more significant unstable angina. EKG in the office shows a stable right bundle branch block with no new ischemic changes.  Earl Keller returns today from his recent hospitalization. He was referred in for unstable angina and underwent heart catheterization. This demonstrated the  following:  Procedure Performed:  1. Left Heart Catheterization 2. Selective Coronary Angiography 3. SVG angiography 4. LIMA graft angiography 5. Left ventricular angiogram 6. PTCA/DES x 1 proximal body of SVG to intermediate branch 7. Angioseal right femoral artery  Operator: [name], MD  Indication: 75 yo male with history of 5V CABG in 2003 with most recent cath September 2012 with 4/5 patent grafts (SVG to Diagonal occluded) but noted to have severe disease in native targets with no options for PCI at that time. Recently having chest pain c/w unstable angina.   Procedure Details: The risks, benefits, complications, treatment options, and expected outcomes were discussed with the patient. The patient and/or family concurred with the proposed plan, giving informed consent. The patient was brought to the cath lab after IV hydration was begun and oral premedication was given. The patient was further sedated with Versed and Fentanyl. The right groin was prepped and draped in the usual manner. Using the modified Seldinger access technique, a 5 French sheath was placed in the right femoral artery. Standard diagnostic catheters were used to perform selective coronary angiography. The JR4 catheter was used to engage the RCA, the LIMA graft and all vein grafts. A pigtail catheter was used to perform a left ventricular angiogram. He was found to have severe stenosis in the ostium of the SVG to the intermediate branch. I elected to proceed to PCI of this vein graft.   PCI Note: He was given 600 mg Plavix po x 1. Angiomax weight based bolus given and drip started. When the ACT was over 200, I engaged the SVG to the  intermediate branch with a 6 French LCB guiding catheter. I then passed a Cougar IC wire down the SVG into the target intermediate branch. I did not use a distal protection device given the short distance of the graft to the target. I also needed to  make guide manipulations with the ostial location of the stenosis and did not want to have frequent drift of the distal protection device. A 2.5 x 12 mm balloon was used to pre-dilate the ostial stenosis x 1. I then deployed a 3.0 x 16 mm Promus Premier DES in the ostium of the SVG. The stent was post-dilated x 1 with a 3.25 x 12 mm [place] balloon x 1. The stenosis was taken from 99% down to 10%. He was given 24 mcg IC adenosine. There was excellent flow into the target vessel. Angioseal placed right femoral artery.   There were no immediate complications. The patient was taken to the recovery area in stable condition.   Hemodynamic Findings: Central aortic pressure: 118/58 Left ventricular pressure: 119/6/20  Angiographic Findings:  Left main: 10% ostial stenosis.   Left Anterior Descending Artery: Moderate caliber vessel that courses to the apex. The proximal vessel has diffuse 60-70% stenosis supplying a small diagonal branch. The mid and distal vessel fills antegrade and from the patent IMA graft.   Circumflex Artery: 100% ostial occlusion. The intermediate branch fills from the patent vein graft. The obtuse marginal branch fills from right to left collaterals (through filling of the SVG to the PDA).   Right Coronary Artery: 100% mid occlusion. The PDA and PLA fill from the patent vein graft. There is severe stenosis in the native PDA retrograde from the graft insertion but this is unchanged from last cath in 2012. This leads to the PLA.   Graft Anatomy:  SVG to Diagonal is occluded SVG sequential to intermediate branch and obtuse marginal is patent to the intermediate but occluded distal limb to the obtuse marginal.  SVG to PDA is patent with 50% anastomosis lesion into the PDA (unchanged from last cath) and as noted above, there is severe disease in the PDA in the more proximal portion of the vessel than where the graft inserts. This supplies the PLA retrograde and is unchanged from last  cath. (Unfavorable target for PCI).  LIMA graft to mid LAD is patent. 40% ostial stenosis IMA, unchanged from last cath.   Left Ventricular Angiogram: LVEF=40% with anterior wall hypokinesis. Mild MR.   Impression: 1. 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  2. 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 Mild to moderate LV systolic dysfunction 4. Successful PTCA/DES x 1 ostium of SVG to intermediate branch.   Recommendations: He will need dual anti-platelet therapy with ASA and Plavix for at least one year.    Complications: None. The patient tolerated the procedure well.  Since his percutaneous intervention to the vein graft to the intermediate branch she is doing much better. He denies any further angina. Unfortunately, his main complaint today is that he has pain in the first MTP joint of the right foot with redness and swelling as well as warmth. This has been going on for couple days and is difficult for him to ambulate. He says he's had some episode like this about a year ago in the left foot. He denies any prior diagnosis of gout. He does carry a history of rheumatoid arthritis, the details of which are not totally clear to me. I'm not  sure if he is followed up by a rheumatologist.  Earl Keller returns today for follow-up. He reports that his gout has resolved. His uric acid level was elevated at 8.0 however he was not back to see his primary care provider for treatment. Unfortunately, an EKG in the office today shows either atypical atrial flutter or coarse atrial fibrillation. This is a new diagnosis for him. I showed him the EKGs and explained atrial fibrillation in great detail including the increased risk for stroke. He seemed to have a difficult time understanding the need to be on anticoagulation. We did discuss the elevated bleeding risk that he may face since he's currently on aspirin and Plavix for a stent that was placed in February.  He seems to be asymptomatic although recently has had some more fatigue which could be attributable to this. He denies any chest pain.   I saw Earl Keller back today in the office. Fortunately he has converted back to sinus rhythm. 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 Unfortunately he did not start the blood thinner. He told me he has a strong family history of bleeding and his father almost bled to death on warfarin. He is also had some GI bleeding and is very concerned about the medication. He understands that his CHADSVASC score is 3, therefore increasing his wrist to about 5% annually for stroke. Despite these warnings he is adamant against taking a blood thinner. He understands the stroke could be physically disabling and even lead to death.  04/16/2016  Earl Keller returns today for follow-up. He is without complaints at this time. He continues to decline taking anticoagulation because of concerns. He understands that he is at increased stroke risk with his A. fib. He fortunately has not had recurrent A. fib that was not aware of it immediately. EKG shows stable sinus bradycardia with first-degree AV block. On his heart catheterization 1 year ago it was noted that EF was low at 40-45%. This has not been reassessed since that time.  PMHx:  Past Medical History  Diagnosis Date  . CAD (coronary artery disease)     post bypass in 2003  . Dyslipidemia   . Hypertension   . Complication of anesthesia   . RA (rheumatoid arthritis) (HCC)   . GERD (gastroesophageal reflux disease)     Past Surgical History  Procedure Laterality Date  . Coronary artery bypass graft  01/2002    EF 45%  . Cardiac catheterization  2012    showed patent grafts with small- vessel disease and distal disease of the native vessels. 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 Coronary angioplasty with stent placement  11/30/2014    PTCA/DES OSLIUM  OF SVG  . Cardiac catheterization  11/30/2014    Procedure: LEFT HEART CATH AND CORS/GRAFTS ANGIOGRAPHY;  Surgeon: Kathleene Hazel, MD;  Location: [facility] CATH LAB;  Service: Cardiovascular;;  . Cardiac catheterization  11/30/2014    Procedure: CORONARY STENT INTERVENTION;  Surgeon: Kathleene Hazel, MD;  Location[facility] CATH LAB;  Service: Cardiovascular;;  SVG to RI    FAMHx:  Family History  Problem Relation Age of Onset  . Sudden death Mother 62  . Liver disease Maternal Grandmother   . Anuerysm Maternal Grandfather   . Stroke  Paternal Grandmother   . Heart attack Paternal Grandfather     SOCHx:   reports that he has never smoked. He has never used smokeless tobacco. He reports that he does not drink alcohol or use illicit drugs.  ALLERGIES:  Allergies  Allergen Reactions  . Ranexa [Ranolazine] Other (See Comments)    [place]    ROS: A comprehensive review of systems was negative.  HOME MEDS: Current Outpatient Prescriptions  Medication Sig Dispense Refill  . atorvastatin (LIPITOR) 40 MG tablet Take 1 tablet (40 mg total) by mouth daily. 90 tablet 3  . clopidogrel (PLAVIX) 75 MG tablet Take 1 tablet (75 mg total) by mouth daily with breakfast. 90 tablet 3  . isosorbide mononitrate (IMDUR) 60 MG 24 hr tablet Take 1 tablet (60 mg total) by mouth daily. 90 tablet 3  . losartan (COZAAR) 25 MG tablet Take 1 tablet (25 mg total) by mouth daily. 90 tablet 1  . metoprolol tartrate (LOPRESSOR) 25 MG tablet Take 0.5 tablets (12.5 mg total) by mouth 2 (two) times daily. 90 tablet 3  . nitroGLYCERIN (NITROSTAT) 0.4 MG SL tablet Place 1 tablet (0.4 mg total) under the tongue every 5 (five) minutes as needed for chest pain. 25 tablet 3  . pantoprazole (PROTONIX) 20 MG tablet Take 1 tablet (20 mg total) by mouth daily. 90 tablet 3   No current  facility-administered medications for this visit.    LABS/IMAGING: No results found for this or any previous visit (from the past 48 hour(s)). No results found.  VITALS: BP 158/80 mmHg  Pulse 52  Ht 6\' 2"  (1.88 m)  Wt 211 lb (95.709 kg)  BMI 27.08 kg/m2  EXAM: General appearance: alert and no distress Neck: no carotid bruit and no JVD Lungs: clear to auscultation bilaterally Heart: regular rate and rhythm, S1, S2 normal, no murmur, click, rub or gallop Abdomen: soft, non-tender; bowel sounds normal; no masses,  no organomegaly Extremities: extremities normal, atraumatic, no cyanosis or edema Pulses: 2+ and symmetric Skin: Skin color, texture, turgor normal. No rashes or lesions Neurologic: Grossly normal Psych: Pleasant  EKG: Sinus bradycardia and first-degree AV block, bifascicular block 52  ASSESSMENT:  1. 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 Unstable angina - status post PCI to the SVG to intermediate branch with a Promus Premier DES (11/2014) 4. CAD s/p CABG x 5 in 2003 5. Dyslipidemia 6. Ischemic cardioymyopathy, EF 40-45% 7. HTN  PLAN: 1.   Earl Keller continues to decline any recommended therapies that I have. He has an increased risk for stroke and was recommended to be on anticoagulation but is not interested in that. I had recommended seeing his primary care provider about his elevated uric acid level and gout attack which he was treated by me for. Unfortunately his primary care provider retired and he did not establish a new primary care doctor yet. I advised him that he should get a new primary care provider and he is looking into one in [place]. He did however agree to a repeat echocardiogram which would be important to see if his ejection fraction has improved after his PCI. I will contact him with results of that echocardiogram and follow-up with him annually or sooner as  necessary.  Baldwin park, MD, [facility] Attending Cardiologist [facility]  [facility] 03/26/2016, 6:20 PM

## 2016-04-30 ENCOUNTER — Other Ambulatory Visit: Payer: Self-pay

## 2016-04-30 ENCOUNTER — Ambulatory Visit (HOSPITAL_COMMUNITY): Payer: Medicare Other | Attending: Internal Medicine

## 2016-04-30 DIAGNOSIS — I340 Nonrheumatic mitral (valve) insufficiency: Secondary | ICD-10-CM | POA: Diagnosis not present

## 2016-04-30 DIAGNOSIS — I2510 Atherosclerotic heart disease of native coronary artery without angina pectoris: Secondary | ICD-10-CM | POA: Insufficient documentation

## 2016-04-30 DIAGNOSIS — I255 Ischemic cardiomyopathy: Secondary | ICD-10-CM | POA: Diagnosis not present

## 2016-04-30 DIAGNOSIS — E785 Hyperlipidemia, unspecified: Secondary | ICD-10-CM | POA: Diagnosis not present

## 2016-04-30 DIAGNOSIS — I10 Essential (primary) hypertension: Secondary | ICD-10-CM | POA: Diagnosis not present

## 2016-04-30 DIAGNOSIS — Z951 Presence of aortocoronary bypass graft: Secondary | ICD-10-CM | POA: Diagnosis not present

## 2016-04-30 LAB — ECHOCARDIOGRAM COMPLETE
AOASC: 34 cm
E decel time: 229 msec
E/e' ratio: 11.54
FS: 21 % — AB (ref 28–44)
IVS/LV PW RATIO, ED: 0.91
LA ID, A-P, ES: 48 mm
LA diam end sys: 48 mm
LA diam index: 2.14 cm/m2
LA vol A4C: 48 ml
LA vol: 60 mL
LAVOLIN: 26.7 mL/m2
LDCA: 4.15 cm2
LV E/e'average: 11.54
LV PW d: 10.1 mm — AB (ref 0.6–1.1)
LV TDI E'LATERAL: 7.09
LV TDI E'MEDIAL: 6.03
LV e' LATERAL: 7.09 cm/s
LVEEMED: 11.54
LVOT SV: 100 mL
LVOT VTI: 24.2 cm
LVOT diameter: 23 mm
LVOT peak grad rest: 5 mmHg
LVOTPV: 109 cm/s
MV Dec: 229
MV pk A vel: 54.8 m/s
MV pk E vel: 81.8 m/s
MVPG: 3 mmHg

## 2016-06-03 ENCOUNTER — Other Ambulatory Visit: Payer: Self-pay | Admitting: Internal Medicine

## 2016-09-09 ENCOUNTER — Other Ambulatory Visit: Payer: Self-pay | Admitting: Internal Medicine

## 2016-11-23 ENCOUNTER — Telehealth: Payer: Self-pay | Admitting: Internal Medicine

## 2016-11-23 NOTE — Telephone Encounter (Signed)
New Message  Pt voiced wanting nurse to reach out to him.  Pt voiced he has angina and would like to f/u with md-hilty  Advised pt of 3.1.18, md-hilty and offered app/pa.  Pt voiced he would like for nurse to contact him.  Please f/u

## 2016-11-23 NOTE — Telephone Encounter (Signed)
Called to offer sooner APP appointment for Tues Jan 30 @ 230. Patient prefers to keep Friday Feb 2 AM appt

## 2016-11-23 NOTE — Telephone Encounter (Signed)
Per patient, over the lastonth or so, he has had anginal like symptoms. He has these spells in the late afternoon or night - when he is semi-active, while walking in the evenings outside. He states it worse after he eats and then goes out walking. He states his discomfort eases with rest. Denies shortness of breath, lightheadedness/dizziness, palpitations. Patient does not routinely check BP/HR but reports general BP is 130s/70s  He takes imdur QAM - wanted to know if this needed to be increased or take it twice daily He had 2 spells when it got "fairly intense" and he took 1 NTG and it eased the pain   Patient has not seen MD since June 2017 - scheduled for PA OV Feb 2 @ 10am  Advised patient to seek ED eval at [facility] should symptoms worsen, increase in frequency/severity   Will route to MD for further advice.

## 2016-11-23 NOTE — Telephone Encounter (Signed)
Thanks - Dr H 

## 2016-11-27 ENCOUNTER — Telehealth: Payer: Self-pay | Admitting: Internal Medicine

## 2016-11-27 ENCOUNTER — Ambulatory Visit: Payer: Medicare Other | Admitting: Physician Assistant

## 2016-11-27 NOTE — Telephone Encounter (Signed)
Note not needed 

## 2016-11-30 ENCOUNTER — Ambulatory Visit (INDEPENDENT_AMBULATORY_CARE_PROVIDER_SITE_OTHER): Payer: Medicare Other | Admitting: Internal Medicine

## 2016-11-30 ENCOUNTER — Encounter: Payer: Self-pay | Admitting: Internal Medicine

## 2016-11-30 VITALS — BP 117/63 | HR 52 | Ht 74.0 in | Wt 203.2 lb

## 2016-11-30 DIAGNOSIS — I208 Other forms of angina pectoris: Secondary | ICD-10-CM

## 2016-11-30 DIAGNOSIS — I480 Paroxysmal atrial fibrillation: Secondary | ICD-10-CM

## 2016-11-30 DIAGNOSIS — Z951 Presence of aortocoronary bypass graft: Secondary | ICD-10-CM | POA: Diagnosis not present

## 2016-11-30 DIAGNOSIS — R1319 Other dysphagia: Secondary | ICD-10-CM

## 2016-11-30 DIAGNOSIS — I2089 Other forms of angina pectoris: Secondary | ICD-10-CM

## 2016-11-30 MED ORDER — ISOSORBIDE MONONITRATE ER 60 MG PO TB24: ORAL_TABLET | ORAL | 1 refills | Status: DC

## 2016-11-30 NOTE — Patient Instructions (Addendum)
Your physician has recommended you make the following change in your medication:  -- INCREASE imdur to 60mg  in the morning and 30mg  in the evening  Your physician recommends that you schedule a follow-up appointment in ONE MONTH with Dr.  Dr. recommends that you schedule an appointment with GI doctor (gastroenterologist)

## 2016-11-30 NOTE — Progress Notes (Signed)
OFFICE NOTE  Chief Complaint:  Chest pain, difficulty swallowing  Primary Care Physician: No PCP Per Patient  HPI:  Earl Keller is a 76 y.o. male with a history of coronary artery bypass grafting in 2003. In 2012 and non-ST elevation myocardial infarction and catheterization at that time showed patent grafts with small vessel disease and distal disease of native vessels. The ejection fraction that time was 45%. The patient has been doing well on medical therapy with no complaints of angina. He states he is quite active does some hiking and biking fairly regularly without any concerns. He currently denies nausea, vomiting, fever, chest pain, shortness of breath, orthopnea, dizziness, PND, cough, congestion, abdominal pain, hematochezia, melena, lower extremity edema, claudication.  He was previously on Ranexa and isosorbide.   I saw Earl Keller back in the office today. He reports that since January he's been developing a dull aching chest pain. The symptoms came on when walking after about a mile and improved a little bit. Recently he's been having symptoms that are lasting more during a walk and eventually has gotten to a point now where he cannot go on longer walks. He called the office and was restarted on his indoor and continues to have chest pain. He's had 2 episodes of chest discomfort in the past 2 days were taken nitroglycerin which provided some relief. We did note that his nitroglycerin as it is somewhat expired however does burn minimally under his tongue. He reported that initially had improvement in his pain however the symptoms came back after about an hour or so. These are occurring now at rest suggesting that this is more significant unstable angina. EKG in the office shows a stable right bundle branch block with no new ischemic changes.  Earl Keller returns today from his recent hospitalization. He was referred in for unstable angina and underwent heart catheterization. This  demonstrated the following:  Procedure Performed:  1. Left Heart Catheterization 2. Selective Coronary Angiography 3. SVG angiography 4. LIMA graft angiography 5. Left ventricular angiogram 6. PTCA/DES x 1 proximal body of SVG to intermediate branch 7. Angioseal right femoral artery  Operator: [name], MD  Indication: 76 yo male with history of 5V CABG in 2003 with most recent cath September 2012 with 4/5 patent grafts (SVG to Diagonal occluded) but noted to have severe disease in native targets with no options for PCI at that time. Recently having chest pain c/w unstable angina.   Procedure Details: The risks, benefits, complications, treatment options, and expected outcomes were discussed with the patient. The patient and/or family concurred with the proposed plan, giving informed consent. The patient was brought to the cath lab after IV hydration was begun and oral premedication was given. The patient was further sedated with Versed and Fentanyl. The right groin was prepped and draped in the usual manner. Using the modified Seldinger access technique, a 5 French sheath was placed in the right femoral artery. Standard diagnostic catheters were used to perform selective coronary angiography. The JR4 catheter was used to engage the RCA, the LIMA graft and all vein grafts. A pigtail catheter was used to perform a left ventricular angiogram. He was found to have severe stenosis in the ostium of the SVG to the intermediate branch. I elected to proceed to PCI of this vein graft.   PCI Note: He was given 600 mg Plavix po x 1. Angiomax weight based bolus given and drip started. When the ACT was over 200, I  engaged the SVG to the intermediate branch with a 6 French LCB guiding catheter. I then passed a Cougar IC wire down the SVG into the target intermediate branch. I did not use a distal protection device given the short distance of the graft to the target. I  also needed to make guide manipulations with the ostial location of the stenosis and did not want to have frequent drift of the distal protection device. A 2.5 x 12 mm balloon was used to pre-dilate the ostial stenosis x 1. I then deployed a 3.0 x 16 mm Promus Premier DES in the ostium of the SVG. The stent was post-dilated x 1 with a 3.25 x 12 mm Gila balloon x 1. The stenosis was taken from 99% down to 10%. He was given 24 mcg IC adenosine. There was excellent flow into the target vessel. Angioseal placed right femoral artery.   There were no immediate complications. The patient was taken to the recovery area in stable condition.   Hemodynamic Findings: Central aortic pressure: 118/58 Left ventricular pressure: 119/6/20  Angiographic Findings:  Left main: 10% ostial stenosis.   Left Anterior Descending Artery: Moderate caliber vessel that courses to the apex. The proximal vessel has diffuse 60-70% stenosis supplying a small diagonal branch. The mid and distal vessel fills antegrade and from the patent IMA graft.   Circumflex Artery: 100% ostial occlusion. The intermediate branch fills from the patent vein graft. The obtuse marginal branch fills from right to left collaterals (through filling of the SVG to the PDA).   Right Coronary Artery: 100% mid occlusion. The PDA and PLA fill from the patent vein graft. There is severe stenosis in the native PDA retrograde from the graft insertion but this is unchanged from last cath in 2012. This leads to the PLA.   Graft Anatomy:  SVG to Diagonal is occluded SVG sequential to intermediate branch and obtuse marginal is patent to the intermediate but occluded distal limb to the obtuse marginal.  SVG to PDA is patent with 50% anastomosis lesion into the PDA (unchanged from last cath) and as noted above, there is severe disease in the PDA in the more proximal portion of the vessel than where the graft inserts. This supplies the PLA retrograde and is  unchanged from last cath. (Unfavorable target for PCI).  LIMA graft to mid LAD is patent. 40% ostial stenosis IMA, unchanged from last cath.   Left Ventricular Angiogram: LVEF=40% with anterior wall hypokinesis. Mild MR.   Impression: 1. 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  2. 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 Mild to moderate LV systolic dysfunction 4. Successful PTCA/DES x 1 ostium of SVG to intermediate branch.   Recommendations: He will need dual anti-platelet therapy with ASA and Plavix for at least one year.    Complications: None. The patient tolerated the procedure well.  Since his percutaneous intervention to the vein graft to the intermediate branch she is doing much better. He denies any further angina. Unfortunately, his main complaint today is that he has pain in the first MTP joint of the right foot with redness and swelling as well as warmth. This has been going on for couple days and is difficult for him to ambulate. He says he's had some episode like this about a year ago in the left foot. He denies any prior diagnosis of gout. He does carry a history of rheumatoid arthritis, the details of which are not totally  clear to me. I'm not sure if he is followed up by a rheumatologist.  Earl Keller returns today for follow-up. He reports that his gout has resolved. His uric acid level was elevated at 8.0 however he was not back to see his primary care provider for treatment. Unfortunately, an EKG in the office today shows either atypical atrial flutter or coarse atrial fibrillation. This is a new diagnosis for him. I showed him the EKGs and explained atrial fibrillation in great detail including the increased risk for stroke. He seemed to have a difficult time understanding the need to be on anticoagulation. We did discuss the elevated bleeding risk that he may face since he's currently on aspirin and Plavix for a stent that was  placed in February. He seems to be asymptomatic although recently has had some more fatigue which could be attributable to this. He denies any chest pain.   I saw Earl Keller back today in the office. Fortunately he has converted back to sinus rhythm. 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 Unfortunately he did not start the blood thinner. He told me he has a strong family history of bleeding and his father almost bled to death on warfarin. He is also had some GI bleeding and is very concerned about the medication. He understands that his CHADSVASC score is 3, therefore increasing his wrist to about 5% annually for stroke. Despite these warnings he is adamant against taking a blood thinner. He understands the stroke could be physically disabling and even lead to death.  April 04, 2016  Earl Keller returns today for follow-up. He is without complaints at this time. He continues to decline taking anticoagulation because of concerns. He understands that he is at increased stroke risk with his A. fib. He fortunately has not had recurrent A. fib that was not aware of it immediately. EKG shows stable sinus bradycardia with first-degree AV block. On his heart catheterization 1 year ago it was noted that EF was low at 40-45%. This has not been reassessed since that time.  11/30/2016   Earl Keller returns today for follow-up. He reports she's recently been having some symptoms he's concerned with being angina. He says with exercising on the treadmill he gets some chest discomfort. It tends to get better with rest. He has had to take nitroglycerin 3 times in the last month. He also has some difficulty swallowing. Occasionally this gives him pressure in his chest. He says food sometimes gets stuck when swallowing however he seems to be able to swallow better with hot liquids. He has a remote history of EGD in the past but is never had a documented stricture or dilatation of the esophagus.  PMHx:  Past Medical History:  Diagnosis  Date  . CAD (coronary artery disease)    post bypass in 2003  . Complication of anesthesia   . Dyslipidemia   . GERD (gastroesophageal reflux disease)   . Hypertension   . RA (rheumatoid arthritis) (HCC)     Past Surgical History:  Procedure Laterality Date  . CARDIAC CATHETERIZATION  2012   showed patent grafts with small- vessel disease and distal disease of the native vessels. 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 CARDIAC CATHETERIZATION  11/30/2014   Procedure: LEFT HEART CATH AND  CORS/GRAFTS ANGIOGRAPHY;  Surgeon: Kathleene Hazel, MD;  Location: [facility] CATH LAB;  Service: Cardiovascular;;  . CARDIAC CATHETERIZATION  11/30/2014   Procedure: CORONARY STENT INTERVENTION;  Surgeon: Kathleene Hazel, MD;  Location[facility] CATH LAB;  Service: Cardiovascular;;  SVG to RI  . CORONARY ANGIOPLASTY WITH STENT PLACEMENT  11/30/2014   PTCA/DES OSLIUM  OF SVG  . CORONARY ARTERY BYPASS GRAFT  01/2002   EF 45%    FAMHx:  Family History  Problem Relation Age of Onset  . Sudden death Mother 60  . Liver disease Maternal Grandmother   . Anuerysm Maternal Grandfather   . Stroke Paternal Grandmother   . Heart attack Paternal Grandfather     SOCHx:   reports that he has never smoked. He has never used smokeless tobacco. He reports that he does not drink alcohol or use drugs.  ALLERGIES:  Allergies  Allergen Reactions  . Ranexa [Ranolazine] Other (See Comments)    Vivid Dreams    ROS: A comprehensive review of systems was negative.  HOME MEDS: Current Outpatient Prescriptions  Medication Sig Dispense Refill  . atorvastatin (LIPITOR) 40 MG tablet TAKE ONE TABLET BY MOUTH ONCE DAILY 90 tablet 3  . clopidogrel (PLAVIX) 75 MG tablet TAKE ONE TABLET BY MOUTH ONCE DAILY WITH BREAKFAST  90 tablet 3  . isosorbide mononitrate (IMDUR) 60 MG 24 hr tablet Take 1 tablet (60mg ) by mouth in the morning and 1/2 tablet (30mg ) by mouth in the evening. 135 tablet 1  . losartan (COZAAR) 25 MG tablet TAKE ONE TABLET BY MOUTH ONCE DAILY 90 tablet 1  . metoprolol tartrate (LOPRESSOR) 25 MG tablet TAKE ONE-HALF TABLET BY MOUTH TWICE DAILY 90 tablet 3  . nitroGLYCERIN (NITROSTAT) 0.4 MG SL tablet Place 1 tablet (0.4 mg total) under the tongue every 5 (five) minutes as needed for chest pain. 25 tablet 3  . pantoprazole (PROTONIX) 20 MG tablet TAKE ONE TABLET BY MOUTH ONCE DAILY (Patient not taking: Reported on 11/30/2016) 90 tablet 3   No current facility-administered medications for this visit.     LABS/IMAGING: No results found for this or any previous visit (from the past 48 hour(s)). No results found.  VITALS: BP 117/63 (BP Location: Right Arm)   Pulse (!) 52   Ht 6\' 2"  (1.88 m)   Wt 203 lb 3.2 oz (92.2 kg)   BMI 26.09 kg/m   EXAM: General appearance: alert and no distress Neck: no carotid bruit and no JVD Lungs: clear to auscultation bilaterally Heart: regular rate and rhythm, S1, S2 normal, no murmur, click, rub or gallop Abdomen: soft, non-tender; bowel sounds normal; no masses,  no organomegaly Extremities: extremities normal, atraumatic, no cyanosis or edema Pulses: 2+ and symmetric Skin: Skin color, texture, turgor normal. No rashes or lesions Neurologic: Grossly normal Psych: Pleasant  EKG: Sinus bradycardia and first-degree AV block, bifascicular block 52  ASSESSMENT:  1. Possible unstable angina 2. 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 Status post PCI to the SVG to intermediate branch with a Promus Premier DES (11/2014) 5. CAD s/p CABG x 5 in 2003 6. Dyslipidemia 7. Ischemic cardioymyopathy, EF 40-45% 8. HTN  PLAN: 1.   Earl Keller is describing symptoms concerning for  unstable angina. He's had to take 3 extra nitroglycerin on top of his isosorbide. Sometimes the symptoms happen more likely in the evening. He's also had some exertional symptoms on the treadmill. Those seem to go  away at rest. I like to increase his nitrate by adding an extra half tablet or 30 mg every afternoon and continue 60 mg every morning. He has previously taken Ranexa but had side effects with that including vivid dreams. He also reports difficulty swallowing and I've encouraged him to follow-up with his gastroenterologist in [place] as he may need an endoscopy plus or minus esophageal dilatation.   Follow-up in one month. If his anginal symptoms have not improved, he may need repeat heart catheterization.   Chrystie Nose, MD, [facility] Attending Cardiologist [facility]  Chrystie Nose 11/30/2016, 3:58 PM

## 2016-12-28 ENCOUNTER — Ambulatory Visit
Admission: RE | Admit: 2016-12-28 | Discharge: 2016-12-28 | Disposition: A | Discharge status: Home / Self Care | Payer: Medicare Other | Source: Ambulatory Visit | Attending: Internal Medicine | Admitting: Internal Medicine

## 2016-12-28 ENCOUNTER — Encounter: Payer: Self-pay | Admitting: Internal Medicine

## 2016-12-28 ENCOUNTER — Ambulatory Visit (INDEPENDENT_AMBULATORY_CARE_PROVIDER_SITE_OTHER): Payer: Medicare Other | Admitting: Internal Medicine

## 2016-12-28 VITALS — BP 138/70 | HR 74 | Ht 74.0 in | Wt 199.0 lb

## 2016-12-28 DIAGNOSIS — R5383 Other fatigue: Secondary | ICD-10-CM | POA: Diagnosis not present

## 2016-12-28 DIAGNOSIS — Z79899 Other long term (current) drug therapy: Secondary | ICD-10-CM

## 2016-12-28 DIAGNOSIS — D689 Coagulation defect, unspecified: Secondary | ICD-10-CM

## 2016-12-28 DIAGNOSIS — Z01818 Encounter for other preprocedural examination: Secondary | ICD-10-CM

## 2016-12-28 DIAGNOSIS — I200 Unstable angina: Secondary | ICD-10-CM | POA: Diagnosis not present

## 2016-12-28 LAB — BASIC METABOLIC PANEL
BUN: 14 mg/dL (ref 7–25)
CO2: 27 mmol/L (ref 20–31)
CREATININE: 0.96 mg/dL (ref 0.70–1.18)
Calcium: 9.6 mg/dL (ref 8.6–10.3)
Chloride: 105 mmol/L (ref 98–110)
Glucose, Bld: 92 mg/dL (ref 65–99)
Potassium: 4.5 mmol/L (ref 3.5–5.3)
Sodium: 140 mmol/L (ref 135–146)

## 2016-12-28 LAB — CBC
HCT: 46.8 % (ref 38.5–50.0)
Hemoglobin: 15.6 g/dL (ref 13.2–17.1)
MCH: 29.3 pg (ref 27.0–33.0)
MCHC: 33.3 g/dL (ref 32.0–36.0)
MCV: 88 fL (ref 80.0–100.0)
MPV: 10.7 fL (ref 7.5–12.5)
PLATELETS: 185 10*3/uL (ref 140–400)
RBC: 5.32 MIL/uL (ref 4.20–5.80)
RDW: 14.4 % (ref 11.0–15.0)
WBC: 5.7 10*3/uL (ref 3.8–10.8)

## 2016-12-28 LAB — TSH: TSH: 0.71 mIU/L (ref 0.40–4.50)

## 2016-12-28 NOTE — Patient Instructions (Signed)
Your physician has requested that you have a cardiac catheterization @ [facility] next week w/Dr. Clifton James. Cardiac catheterization is used to diagnose and/or treat various heart conditions. Doctors may recommend this procedure for a number of different reasons. The most common reason is to evaluate chest pain. Chest pain can be a symptom of coronary artery disease (CAD), and cardiac catheterization can show whether plaque is narrowing or blocking your heart's arteries. This procedure is also used to evaluate the valves, as well as measure the blood flow and oxygen levels in different parts of your heart. For further information please visit [email or web address] Please follow instruction sheet, as given.  Following your catheterization, you will not be allowed to drive for 3 days.  No lifting, pushing, or pulling greater that 10 pounds is allowed for 1 week.  You will be required to have the following tests prior to the procedure:  1. Blood work - the blood work can be done no more than 14 days prior to the procedure.  It can be done at any [facility] lab. There is a lab downstairs on the first floor of this building in [address] and one at [address].  2. Chest X-ray - this can be done at [facility] in the [facility] @ [address]  Your physician recommends that you schedule a follow-up appointment in: 2-3 weeks after cath w/Dr. Rennis Golden

## 2016-12-29 ENCOUNTER — Encounter: Payer: Self-pay | Admitting: Internal Medicine

## 2016-12-29 LAB — PROTIME-INR
INR: 1
PROTHROMBIN TIME: 10.7 s (ref 9.0–11.5)

## 2016-12-29 LAB — APTT: aPTT: 24 s (ref 22–34)

## 2016-12-29 NOTE — Progress Notes (Signed)
OFFICE NOTE  Chief Complaint:  Chest pain, difficulty swallowing  Primary Care Physician: No PCP Per Patient  HPI:  Earl Keller is a 76 y.o. male with a history of coronary artery bypass grafting in 2003. In 2012 and non-ST elevation myocardial infarction and catheterization at that time showed patent grafts with small vessel disease and distal disease of native vessels. The ejection fraction that time was 45%. The patient has been doing well on medical therapy with no complaints of angina. He states he is quite active does some hiking and biking fairly regularly without any concerns. He currently denies nausea, vomiting, fever, chest pain, shortness of breath, orthopnea, dizziness, PND, cough, congestion, abdominal pain, hematochezia, melena, lower extremity edema, claudication.  He was previously on Ranexa and isosorbide.   I saw Earl Keller back in the office today. He reports that since January he's been developing a dull aching chest pain. The symptoms came on when walking after about a mile and improved a little bit. Recently he's been having symptoms that are lasting more during a walk and eventually has gotten to a point now where he cannot go on longer walks. He called the office and was restarted on his indoor and continues to have chest pain. He's had 2 episodes of chest discomfort in the past 2 days were taken nitroglycerin which provided some relief. We did note that his nitroglycerin as it is somewhat expired however does burn minimally under his tongue. He reported that initially had improvement in his pain however the symptoms came back after about an hour or so. These are occurring now at rest suggesting that this is more significant unstable angina. EKG in the office shows a stable right bundle branch block with no new ischemic changes.  Earl Keller returns today from his recent hospitalization. He was referred in for unstable angina and underwent heart catheterization. This  demonstrated the following:  Procedure Performed:  1. Left Heart Catheterization 2. Selective Coronary Angiography 3. SVG angiography 4. LIMA graft angiography 5. Left ventricular angiogram 6. PTCA/DES x 1 proximal body of SVG to intermediate branch 7. Angioseal right femoral artery  Operator: [name], MD  Indication: 76 yo male with history of 5V CABG in 2003 with most recent cath September 2012 with 4/5 patent grafts (SVG to Diagonal occluded) but noted to have severe disease in native targets with no options for PCI at that time. Recently having chest pain c/w unstable angina.   Procedure Details: The risks, benefits, complications, treatment options, and expected outcomes were discussed with the patient. The patient and/or family concurred with the proposed plan, giving informed consent. The patient was brought to the cath lab after IV hydration was begun and oral premedication was given. The patient was further sedated with Versed and Fentanyl. The right groin was prepped and draped in the usual manner. Using the modified Seldinger access technique, a 5 French sheath was placed in the right femoral artery. Standard diagnostic catheters were used to perform selective coronary angiography. The JR4 catheter was used to engage the RCA, the LIMA graft and all vein grafts. A pigtail catheter was used to perform a left ventricular angiogram. He was found to have severe stenosis in the ostium of the SVG to the intermediate branch. I elected to proceed to PCI of this vein graft.   PCI Note: He was given 600 mg Plavix po x 1. Angiomax weight based bolus given and drip started. When the ACT was over 200, I  engaged the SVG to the intermediate branch with a 6 French LCB guiding catheter. I then passed a Cougar IC wire down the SVG into the target intermediate branch. I did not use a distal protection device given the short distance of the graft to the target. I  also needed to make guide manipulations with the ostial location of the stenosis and did not want to have frequent drift of the distal protection device. A 2.5 x 12 mm balloon was used to pre-dilate the ostial stenosis x 1. I then deployed a 3.0 x 16 mm Promus Premier DES in the ostium of the SVG. The stent was post-dilated x 1 with a 3.25 x 12 mm Gila balloon x 1. The stenosis was taken from 99% down to 10%. He was given 24 mcg IC adenosine. There was excellent flow into the target vessel. Angioseal placed right femoral artery.   There were no immediate complications. The patient was taken to the recovery area in stable condition.   Hemodynamic Findings: Central aortic pressure: 118/58 Left ventricular pressure: 119/6/20  Angiographic Findings:  Left main: 10% ostial stenosis.   Left Anterior Descending Artery: Moderate caliber vessel that courses to the apex. The proximal vessel has diffuse 60-70% stenosis supplying a small diagonal branch. The mid and distal vessel fills antegrade and from the patent IMA graft.   Circumflex Artery: 100% ostial occlusion. The intermediate branch fills from the patent vein graft. The obtuse marginal branch fills from right to left collaterals (through filling of the SVG to the PDA).   Right Coronary Artery: 100% mid occlusion. The PDA and PLA fill from the patent vein graft. There is severe stenosis in the native PDA retrograde from the graft insertion but this is unchanged from last cath in 2012. This leads to the PLA.   Graft Anatomy:  SVG to Diagonal is occluded SVG sequential to intermediate branch and obtuse marginal is patent to the intermediate but occluded distal limb to the obtuse marginal.  SVG to PDA is patent with 50% anastomosis lesion into the PDA (unchanged from last cath) and as noted above, there is severe disease in the PDA in the more proximal portion of the vessel than where the graft inserts. This supplies the PLA retrograde and is  unchanged from last cath. (Unfavorable target for PCI).  LIMA graft to mid LAD is patent. 40% ostial stenosis IMA, unchanged from last cath.   Left Ventricular Angiogram: LVEF=40% with anterior wall hypokinesis. Mild MR.   Impression: 1. 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  2. 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 Mild to moderate LV systolic dysfunction 4. Successful PTCA/DES x 1 ostium of SVG to intermediate branch.   Recommendations: He will need dual anti-platelet therapy with ASA and Plavix for at least one year.    Complications: None. The patient tolerated the procedure well.  Since his percutaneous intervention to the vein graft to the intermediate branch she is doing much better. He denies any further angina. Unfortunately, his main complaint today is that he has pain in the first MTP joint of the right foot with redness and swelling as well as warmth. This has been going on for couple days and is difficult for him to ambulate. He says he's had some episode like this about a year ago in the left foot. He denies any prior diagnosis of gout. He does carry a history of rheumatoid arthritis, the details of which are not totally  clear to me. I'm not sure if he is followed up by a rheumatologist.  Earl Keller returns today for follow-up. He reports that his gout has resolved. His uric acid level was elevated at 8.0 however he was not back to see his primary care provider for treatment. Unfortunately, an EKG in the office today shows either atypical atrial flutter or coarse atrial fibrillation. This is a new diagnosis for him. I showed him the EKGs and explained atrial fibrillation in great detail including the increased risk for stroke. He seemed to have a difficult time understanding the need to be on anticoagulation. We did discuss the elevated bleeding risk that he may face since he's currently on aspirin and Plavix for a stent that was  placed in February. He seems to be asymptomatic although recently has had some more fatigue which could be attributable to this. He denies any chest pain.   I saw Earl Keller back today in the office. Fortunately he has converted back to sinus rhythm. 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 Unfortunately he did not start the blood thinner. He told me he has a strong family history of bleeding and his father almost bled to death on warfarin. He is also had some GI bleeding and is very concerned about the medication. He understands that his CHADSVASC score is 3, therefore increasing his wrist to about 5% annually for stroke. Despite these warnings he is adamant against taking a blood thinner. He understands the stroke could be physically disabling and even lead to death.  04/24/16  Earl Keller returns today for follow-up. He is without complaints at this time. He continues to decline taking anticoagulation because of concerns. He understands that he is at increased stroke risk with his A. fib. He fortunately has not had recurrent A. fib that was not aware of it immediately. EKG shows stable sinus bradycardia with first-degree AV block. On his heart catheterization 1 year ago it was noted that EF was low at 40-45%. This has not been reassessed since that time.  11/30/2016   Earl Keller returns today for follow-up. He reports she's recently been having some symptoms he's concerned with being angina. He says with exercising on the treadmill he gets some chest discomfort. It tends to get better with rest. He has had to take nitroglycerin 3 times in the last month. He also has some difficulty swallowing. Occasionally this gives him pressure in his chest. He says food sometimes gets stuck when swallowing however he seems to be able to swallow better with hot liquids. He has a remote history of EGD in the past but is never had a documented stricture or dilatation of the esophagus.  12/28/2016  Earl Keller returns today for  follow-up. At his last office visit I made an increase in his nitroglycerin however this is not significantly improved his angina. He still gets chest pain with exertion and particular with exercise. He says over the past several months his continue to limit his ability to exert himself. Based on this his findings are consistent with unstable angina. He has been intolerant to Ranexa in the past. Options seem to be somewhat limited for medical therapy at this point and I am recommending a cardiac catheterization.  PMHx:  Past Medical History:  Diagnosis Date  . CAD (coronary artery disease)    post bypass in 2003  . Complication of anesthesia   . Dyslipidemia   . GERD (gastroesophageal reflux disease)   . Hypertension   . RA (  rheumatoid arthritis) [facility])     Past Surgical History:  Procedure Laterality Date  . CARDIAC CATHETERIZATION  2012   showed patent grafts with small- vessel disease and distal disease of the native vessels. 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 CARDIAC CATHETERIZATION  11/30/2014   Procedure: LEFT HEART CATH AND CORS/GRAFTS ANGIOGRAPHY;  Surgeon: Kathleene Hazel, MD;  Location[facility] C[facility] LAB;  Service: Cardiovascular;;  . CARDIAC CATHETERIZATION  11/30/2014   Procedure: CORONARY STENT INTERVENTION;  Surgeon: Kathleene Hazel, MD;  Location: [facility] CATH LAB;  Service: Cardiovascular;;  SVG to RI  . CORONARY ANGIOPLASTY WITH STENT PLACEMENT  11/30/2014   PTCA/DES OSLIUM  OF SVG  . CORONARY ARTERY BYPASS GRAFT  01/2002   EF 45%    FAMHx:  Family History  Problem Relation Age of Onset  . Sudden death Mother 65  . Liver disease Maternal Grandmother   . Anuerysm Maternal Grandfather   . Stroke Paternal Grandmother   . Heart attack Paternal Grandfather      SOCHx:   reports that he has never smoked. He has never used smokeless tobacco. He reports that he does not drink alcohol or use drugs.  ALLERGIES:  Allergies  Allergen Reactions  . Ranexa [Ranolazine] Other (See Comments)    [place]    ROS: A comprehensive review of systems was negative.  HOME MEDS: Current Outpatient Prescriptions  Medication Sig Dispense Refill  . atorvastatin (LIPITOR) 40 MG tablet TAKE ONE TABLET BY MOUTH ONCE DAILY 90 tablet 3  . clopidogrel (PLAVIX) 75 MG tablet TAKE ONE TABLET BY MOUTH ONCE DAILY WITH BREAKFAST 90 tablet 3  . isosorbide mononitrate (IMDUR) 60 MG 24 hr tablet Take 1 tablet (60mg ) by mouth in the morning and 1/2 tablet (30mg ) by mouth in the evening. 135 tablet 1  . losartan (COZAAR) 25 MG tablet TAKE ONE TABLET BY MOUTH ONCE DAILY 90 tablet 1  . metoprolol tartrate (LOPRESSOR) 25 MG tablet TAKE ONE-HALF TABLET BY MOUTH TWICE DAILY 90 tablet 3  . nitroGLYCERIN (NITROSTAT) 0.4 MG SL tablet Place 1 tablet (0.4 mg total) under the tongue every 5 (five) minutes as needed for chest pain. 25 tablet 3  . pantoprazole (PROTONIX) 20 MG tablet TAKE ONE TABLET BY MOUTH ONCE DAILY (Patient not taking: Reported on 11/30/2016) 90 tablet 3   No current facility-administered medications for this visit.     LABS/IMAGING: Results for orders placed or performed in visit on 12/28/16 (from the past 48 hour(s))  CBC     Status: None   Collection Time: 12/28/16 11:39 AM  Result Value Ref Range   WBC 5.7 3.8 - 10.8 K/uL   RBC 5.32 4.20 - 5.80 MIL/uL   Hemoglobin 15.6 13.2 - 17.1 g/dL   HCT 16.1 09.6 - 04.5 %   MCV 88.0 80.0 - 100.0 fL   MCH 29.3 27.0 - 33.0 pg   MCHC 33.3 32.0 - 36.0 g/dL   RDW 40.9 81.1 - 91.4 %   Platelets 185 140 - 400 K/uL   MPV 10.7 7.5 - 12.5 fL  Basic metabolic panel     Status: None   Collection Time: 12/28/16 11:39 AM  Result Value Ref Range   Sodium 140 135 - 146 mmol/L   Potassium  4.5 3.5 - 5.3 mmol/L   Chloride 105  98 - 110 mmol/L   CO2 27 20 - 31 mmol/L   Glucose, Bld 92 65 - 99 mg/dL   BUN 14 7 - 25 mg/dL   Creat 8.12 7.51 - 7.00 mg/dL    Comment:   For patients > or = 74 years of age: The upper reference limit for Creatinine is approximately 13% higher for people identified as African-American.      Calcium 9.6 8.6 - 10.3 mg/dL  APTT     Status: None   Collection Time: 12/28/16 11:39 AM  Result Value Ref Range   aPTT 24 22 - 34 sec    Comment:   This test has not been validated for monitoring unfractionated heparin therapy. For testing that is validated for this type of therapy, please refer to the Heparin Anti-Xa assay (test code 17494).   For additional information, please refer to http://education.[facility].com/faq/FAQ159 (This link is being provided for informational/educational purposes only.)     Protime-INR     Status: None   Collection Time: 12/28/16 11:39 AM  Result Value Ref Range   Prothrombin Time 10.7 9.0 - 11.5 sec    Comment:   For more information on this test, go to: http://education.[facility].com/faq/FAQ104      INR 1.0     Comment:   Reference Range                        0.9-1.1 Moderate-intensity Warfarin Therapy    2.0-3.0 Higher-intensity Warfarin Therapy      3.0-4.0     TSH     Status: None   Collection Time: 12/28/16 11:39 AM  Result Value Ref Range   TSH 0.71 0.40 - 4.50 mIU/L   Dg Chest 2 View  Result Date: 12/28/2016 CLINICAL DATA:  Preoperative examination prior to cardiac catheterization next week. History of previous CABG, hyperlipidemia, rheumatoid arthritis, nonsmoker. EXAM: CHEST  2 VIEW COMPARISON:  PA and lateral chest x-ray of November 29, 2014 FINDINGS: The lungs are adequately inflated. There is no focal infiltrate. There is stable post CABG changes. The heart and pulmonary vascularity are normal. The mediastinum is normal in width. The retrosternal soft tissues are normal. There is no pleural effusion. The bony thorax  exhibits no acute abnormality. IMPRESSION: There is no pneumonia nor other acute cardiopulmonary disease. Stable post CABG changes. Electronically Signed   By: David  [place] M.D.   On: 12/28/2016 14:19    VITALS: BP 138/70 (BP Location: Left Arm)   Pulse 74   Ht 6\' 2"  (1.88 m)   Wt 199 lb (90.3 kg)   BMI 25.55 kg/m   EXAM: Deferred  EKG: Sinus bradycardia and first-degree AV block, bifascicular block 52  ASSESSMENT:  1. Unstable angina 2. 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 Status post PCI to the SVG to intermediate branch with a Promus Premier DES (11/2014) 5. CAD s/p CABG x 5 in 2003 6. Dyslipidemia 7. Ischemic cardioymyopathy, EF 40-45% 8. HTN  PLAN: 1.   Earl Keller is describing symptoms concerning for unstable angina. He's not had any significant improvement with an increase in his nitrates, particularly in the morning but perhaps a little improvement at night. He's avoided a lot of activities due to the fact that he causes him angina. Based on his history and prior stenting in 2016, think is reasonable to consider repeat catheterization.  I did discuss again the risks, benefits and alternatives of cardiac catheterization with him today and he seems agreeable to proceed. We'll try to schedule with Dr. Clifton James again because he performed his prior procedure.  Chrystie Nose, MD, [facility] Attending Cardiologist [facility]  Lisette Abu Peyson Postema 12/29/2016, 6:18 PM

## 2016-12-30 ENCOUNTER — Other Ambulatory Visit: Payer: Self-pay | Admitting: *Deleted

## 2016-12-30 DIAGNOSIS — I200 Unstable angina: Secondary | ICD-10-CM

## 2017-01-04 ENCOUNTER — Other Ambulatory Visit: Payer: Self-pay | Admitting: Internal Medicine

## 2017-01-06 ENCOUNTER — Ambulatory Visit (HOSPITAL_COMMUNITY)
Admission: RE | Admit: 2017-01-06 | Discharge: 2017-01-07 | Disposition: A | Discharge status: Home / Self Care | Payer: Medicare Other | Source: Ambulatory Visit | Attending: Cardiovascular Disease | Admitting: Cardiovascular Disease

## 2017-01-06 ENCOUNTER — Encounter (HOSPITAL_COMMUNITY)
Admission: RE | Disposition: A | Discharge status: Home / Self Care | Payer: Self-pay | Source: Ambulatory Visit | Attending: Cardiovascular Disease

## 2017-01-06 ENCOUNTER — Encounter (HOSPITAL_COMMUNITY): Payer: Self-pay | Admitting: Cardiovascular Disease

## 2017-01-06 DIAGNOSIS — M069 Rheumatoid arthritis, unspecified: Secondary | ICD-10-CM | POA: Insufficient documentation

## 2017-01-06 DIAGNOSIS — I200 Unstable angina: Secondary | ICD-10-CM | POA: Diagnosis present

## 2017-01-06 DIAGNOSIS — I2510 Atherosclerotic heart disease of native coronary artery without angina pectoris: Secondary | ICD-10-CM | POA: Diagnosis present

## 2017-01-06 DIAGNOSIS — I25110 Atherosclerotic heart disease of native coronary artery with unstable angina pectoris: Secondary | ICD-10-CM | POA: Diagnosis not present

## 2017-01-06 DIAGNOSIS — I2582 Chronic total occlusion of coronary artery: Secondary | ICD-10-CM | POA: Insufficient documentation

## 2017-01-06 DIAGNOSIS — I10 Essential (primary) hypertension: Secondary | ICD-10-CM | POA: Diagnosis not present

## 2017-01-06 DIAGNOSIS — I25710 Atherosclerosis of autologous vein coronary artery bypass graft(s) with unstable angina pectoris: Secondary | ICD-10-CM | POA: Diagnosis not present

## 2017-01-06 DIAGNOSIS — E785 Hyperlipidemia, unspecified: Secondary | ICD-10-CM | POA: Diagnosis present

## 2017-01-06 DIAGNOSIS — I4892 Unspecified atrial flutter: Secondary | ICD-10-CM | POA: Insufficient documentation

## 2017-01-06 DIAGNOSIS — M109 Gout, unspecified: Secondary | ICD-10-CM | POA: Diagnosis not present

## 2017-01-06 DIAGNOSIS — T82855A Stenosis of coronary artery stent, initial encounter: Secondary | ICD-10-CM | POA: Diagnosis not present

## 2017-01-06 DIAGNOSIS — I252 Old myocardial infarction: Secondary | ICD-10-CM | POA: Insufficient documentation

## 2017-01-06 DIAGNOSIS — Z7982 Long term (current) use of aspirin: Secondary | ICD-10-CM | POA: Insufficient documentation

## 2017-01-06 DIAGNOSIS — Z7902 Long term (current) use of antithrombotics/antiplatelets: Secondary | ICD-10-CM | POA: Insufficient documentation

## 2017-01-06 DIAGNOSIS — Z955 Presence of coronary angioplasty implant and graft: Secondary | ICD-10-CM

## 2017-01-06 DIAGNOSIS — I4510 Unspecified right bundle-branch block: Secondary | ICD-10-CM | POA: Diagnosis not present

## 2017-01-06 DIAGNOSIS — Y838 Other surgical procedures as the cause of abnormal reaction of the patient, or of later complication, without mention of misadventure at the time of the procedure: Secondary | ICD-10-CM | POA: Diagnosis not present

## 2017-01-06 DIAGNOSIS — K219 Gastro-esophageal reflux disease without esophagitis: Secondary | ICD-10-CM | POA: Diagnosis not present

## 2017-01-06 DIAGNOSIS — Z8249 Family history of ischemic heart disease and other diseases of the circulatory system: Secondary | ICD-10-CM | POA: Diagnosis not present

## 2017-01-06 DIAGNOSIS — I480 Paroxysmal atrial fibrillation: Secondary | ICD-10-CM | POA: Diagnosis not present

## 2017-01-06 HISTORY — PX: LEFT HEART CATH AND CORS/GRAFTS ANGIOGRAPHY: CATH118250

## 2017-01-06 HISTORY — DX: Gastrointestinal hemorrhage, unspecified: K92.2

## 2017-01-06 HISTORY — PX: CORONARY ANGIOPLASTY WITH STENT PLACEMENT: SHX49

## 2017-01-06 HISTORY — DX: Paroxysmal atrial fibrillation: I48.0

## 2017-01-06 HISTORY — DX: Personal history of other diseases of the musculoskeletal system and connective tissue: Z87.39

## 2017-01-06 HISTORY — PX: CORONARY STENT INTERVENTION: CATH118234

## 2017-01-06 HISTORY — DX: Personal history of other medical treatment: Z92.89

## 2017-01-06 HISTORY — DX: Dysphagia, unspecified: R13.10

## 2017-01-06 HISTORY — DX: Non-ST elevation (NSTEMI) myocardial infarction: I21.4

## 2017-01-06 LAB — POCT ACTIVATED CLOTTING TIME
ACTIVATED CLOTTING TIME: 280 s
ACTIVATED CLOTTING TIME: 687 s

## 2017-01-06 SURGERY — LEFT HEART CATH AND CORS/GRAFTS ANGIOGRAPHY

## 2017-01-06 MED ORDER — IOPAMIDOL (ISOVUE-370) INJECTION 76%
INTRAVENOUS | Status: AC
  Filled 2017-01-06: qty 50

## 2017-01-06 MED ORDER — ACETAMINOPHEN 325 MG PO TABS: 650.0000 mg | ORAL_TABLET | ORAL | Status: DC | PRN

## 2017-01-06 MED ORDER — HYDRALAZINE HCL 20 MG/ML IJ SOLN: 5.0000 mg | INTRAMUSCULAR | Status: DC | PRN

## 2017-01-06 MED ORDER — ISOSORBIDE MONONITRATE ER 30 MG PO TB24
30.0000 mg | ORAL_TABLET | Freq: Two times a day (BID) | ORAL | Status: DC
  Administered 2017-01-06 – 2017-01-07 (×2): 30 mg via ORAL
  Filled 2017-01-06 (×2): qty 1

## 2017-01-06 MED ORDER — ANGIOPLASTY BOOK
Freq: Once | Status: AC
  Administered 2017-01-06: 23:00:00
  Filled 2017-01-06: qty 1

## 2017-01-06 MED ORDER — HEPARIN SODIUM (PORCINE) 1000 UNIT/ML IJ SOLN
INTRAMUSCULAR | Status: AC
  Filled 2017-01-06: qty 1

## 2017-01-06 MED ORDER — IOPAMIDOL (ISOVUE-370) INJECTION 76%
INTRAVENOUS | Status: DC | PRN
  Administered 2017-01-06: 185 mL via INTRA_ARTERIAL

## 2017-01-06 MED ORDER — NITROGLYCERIN 0.4 MG SL SUBL: 0.4000 mg | SUBLINGUAL_TABLET | SUBLINGUAL | Status: DC | PRN

## 2017-01-06 MED ORDER — LOSARTAN POTASSIUM 50 MG PO TABS
25.0000 mg | ORAL_TABLET | Freq: Every day | ORAL | Status: DC
  Administered 2017-01-07: 25 mg via ORAL
  Filled 2017-01-06: qty 1

## 2017-01-06 MED ORDER — LIDOCAINE HCL (PF) 1 % IJ SOLN
INTRAMUSCULAR | Status: AC
  Filled 2017-01-06: qty 30

## 2017-01-06 MED ORDER — VERAPAMIL HCL 2.5 MG/ML IV SOLN
INTRAVENOUS | Status: AC
  Filled 2017-01-06: qty 2

## 2017-01-06 MED ORDER — ATORVASTATIN CALCIUM 40 MG PO TABS
40.0000 mg | ORAL_TABLET | Freq: Every day | ORAL | Status: DC
  Administered 2017-01-06: 19:00:00 40 mg via ORAL
  Filled 2017-01-06: qty 1

## 2017-01-06 MED ORDER — SODIUM CHLORIDE 0.9% FLUSH
3.0000 mL | Freq: Two times a day (BID) | INTRAVENOUS | Status: DC
  Administered 2017-01-06 – 2017-01-07 (×3): 3 mL via INTRAVENOUS

## 2017-01-06 MED ORDER — ONDANSETRON HCL 4 MG/2ML IJ SOLN: 4.0000 mg | Freq: Four times a day (QID) | INTRAMUSCULAR | Status: DC | PRN

## 2017-01-06 MED ORDER — HEPARIN (PORCINE) IN NACL 2-0.9 UNIT/ML-% IJ SOLN
INTRAMUSCULAR | Status: AC
  Filled 2017-01-06: qty 1000

## 2017-01-06 MED ORDER — SODIUM CHLORIDE 0.9% FLUSH
3.0000 mL | INTRAVENOUS | Status: DC | PRN
Start: 2017-01-06 — End: 2017-01-06

## 2017-01-06 MED ORDER — LABETALOL HCL 5 MG/ML IV SOLN: 10.0000 mg | INTRAVENOUS | Status: DC | PRN

## 2017-01-06 MED ORDER — MIDAZOLAM HCL 2 MG/2ML IJ SOLN
INTRAMUSCULAR | Status: AC
  Filled 2017-01-06: qty 2

## 2017-01-06 MED ORDER — CLOPIDOGREL BISULFATE 300 MG PO TABS
ORAL_TABLET | ORAL | Status: AC
  Filled 2017-01-06: qty 1

## 2017-01-06 MED ORDER — FENTANYL CITRATE (PF) 100 MCG/2ML IJ SOLN
INTRAMUSCULAR | Status: AC
  Filled 2017-01-06: qty 2

## 2017-01-06 MED ORDER — METOPROLOL TARTRATE 12.5 MG HALF TABLET
12.5000 mg | ORAL_TABLET | Freq: Two times a day (BID) | ORAL | Status: DC
  Administered 2017-01-06 – 2017-01-07 (×2): 12.5 mg via ORAL
  Filled 2017-01-06 (×2): qty 1

## 2017-01-06 MED ORDER — SODIUM CHLORIDE 0.9 % IV SOLN: INTRAVENOUS | Status: DC

## 2017-01-06 MED ORDER — HEPARIN SODIUM (PORCINE) 1000 UNIT/ML IJ SOLN
INTRAMUSCULAR | Status: DC | PRN
  Administered 2017-01-06: 5500 [IU] via INTRAVENOUS
  Administered 2017-01-06: 2000 [IU] via INTRAVENOUS
  Administered 2017-01-06: 4500 [IU] via INTRAVENOUS

## 2017-01-06 MED ORDER — HEPARIN (PORCINE) IN NACL 2-0.9 UNIT/ML-% IJ SOLN
INTRAMUSCULAR | Status: DC | PRN
  Administered 2017-01-06: 1000 mL via INTRA_ARTERIAL

## 2017-01-06 MED ORDER — ASPIRIN 81 MG PO CHEW
81.0000 mg | CHEWABLE_TABLET | ORAL | Status: DC
Start: 2017-01-06 — End: 2017-01-06

## 2017-01-06 MED ORDER — CLOPIDOGREL BISULFATE 75 MG PO TABS
75.0000 mg | ORAL_TABLET | Freq: Every day | ORAL | Status: DC
  Administered 2017-01-07: 09:00:00 75 mg via ORAL
  Filled 2017-01-06: qty 1

## 2017-01-06 MED ORDER — SODIUM CHLORIDE 0.9 % IV SOLN: 250.0000 mL | INTRAVENOUS | Status: DC | PRN

## 2017-01-06 MED ORDER — ASPIRIN EC 81 MG PO TBEC
81.0000 mg | DELAYED_RELEASE_TABLET | Freq: Every day | ORAL | Status: DC
  Administered 2017-01-07: 09:00:00 81 mg via ORAL
  Filled 2017-01-06: qty 1

## 2017-01-06 MED ORDER — SODIUM CHLORIDE 0.9% FLUSH: 3.0000 mL | INTRAVENOUS | Status: DC | PRN

## 2017-01-06 MED ORDER — CLOPIDOGREL BISULFATE 300 MG PO TABS
ORAL_TABLET | ORAL | Status: DC | PRN
  Administered 2017-01-06: 300 mg via ORAL

## 2017-01-06 MED ORDER — SODIUM CHLORIDE 0.9 % IV SOLN
INTRAVENOUS | Status: DC
  Administered 2017-01-06: 08:00:00 via INTRAVENOUS

## 2017-01-06 MED ORDER — MIDAZOLAM HCL 2 MG/2ML IJ SOLN
INTRAMUSCULAR | Status: DC | PRN
  Administered 2017-01-06: 2 mg via INTRAVENOUS

## 2017-01-06 MED ORDER — FENTANYL CITRATE (PF) 100 MCG/2ML IJ SOLN
INTRAMUSCULAR | Status: DC | PRN
  Administered 2017-01-06: 25 ug via INTRAVENOUS

## 2017-01-06 MED ORDER — VERAPAMIL HCL 2.5 MG/ML IV SOLN
INTRAVENOUS | Status: DC | PRN
  Administered 2017-01-06: 10 mL via INTRA_ARTERIAL

## 2017-01-06 MED ORDER — IOPAMIDOL (ISOVUE-370) INJECTION 76%
INTRAVENOUS | Status: AC
  Filled 2017-01-06: qty 125

## 2017-01-06 MED ORDER — LIDOCAINE HCL (PF) 1 % IJ SOLN
INTRAMUSCULAR | Status: DC | PRN
  Administered 2017-01-06: 2 mL via INTRADERMAL

## 2017-01-06 SURGICAL SUPPLY — 19 items
BALLN EUPHORA RX 2.0X12 (BALLOONS) ×3
BALLN ~~LOC~~ MOZEC 3.0X13 (BALLOONS) ×3
BALLOON EUPHORA RX 2.0X12 (BALLOONS) ×1 IMPLANT
BALLOON ~~LOC~~ MOZEC 3.0X13 (BALLOONS) ×1 IMPLANT
CATH INFINITI 5 FR LCB (CATHETERS) ×3 IMPLANT
CATH INFINITI 5FR MULTPACK ANG (CATHETERS) ×3 IMPLANT
DEVICE RAD COMP TR BAND LRG (VASCULAR PRODUCTS) ×3 IMPLANT
GLIDESHEATH SLEND SS 6F .021 (SHEATH) ×3 IMPLANT
GUIDE CATH RUNWAY 6FR LCB (CATHETERS) ×3 IMPLANT
GUIDEWIRE INQWIRE 1.5J.035X260 (WIRE) ×1 IMPLANT
INQWIRE 1.5J .035X260CM (WIRE) ×3
KIT ENCORE 26 ADVANTAGE (KITS) ×3 IMPLANT
KIT HEART LEFT (KITS) ×3 IMPLANT
PACK CARDIAC CATHETERIZATION (CUSTOM PROCEDURE TRAY) ×3 IMPLANT
STENT SYNERGY DES 2.25X24 (Permanent Stent) ×3 IMPLANT
STENT SYNERGY DES 3X16 (Permanent Stent) ×3 IMPLANT
TRANSDUCER W/STOPCOCK (MISCELLANEOUS) ×3 IMPLANT
TUBING CIL FLEX 10 FLL-RA (TUBING) ×3 IMPLANT
WIRE COUGAR XT STRL 190CM (WIRE) ×3 IMPLANT

## 2017-01-06 NOTE — H&P (View-Only) (Signed)
OFFICE NOTE  Chief Complaint:  Chest pain, difficulty swallowing  Primary Care Physician: No PCP Per Patient  HPI:  Earl Keller is a 76 y.o. male with a history of coronary artery bypass grafting in 2003. In 2012 and non-ST elevation myocardial infarction and catheterization at that time showed patent grafts with small vessel disease and distal disease of native vessels. The ejection fraction that time was 45%. The patient has been doing well on medical therapy with no complaints of angina. He states he is quite active does some hiking and biking fairly regularly without any concerns. He currently denies nausea, vomiting, fever, chest pain, shortness of breath, orthopnea, dizziness, PND, cough, congestion, abdominal pain, hematochezia, melena, lower extremity edema, claudication.  He was previously on Ranexa and isosorbide.   I saw [name] back in the office today. He reports that since January he's been developing a dull aching chest pain. The symptoms came on when walking after about a mile and improved a little bit. Recently he's been having symptoms that are lasting more during a walk and eventually has gotten to a point now where he cannot go on longer walks. He called the office and was restarted on his indoor and continues to have chest pain. He's had 2 episodes of chest discomfort in the past 2 days were taken nitroglycerin which provided some relief. We did note that his nitroglycerin as it is somewhat expired however does burn minimally under his tongue. He reported that initially had improvement in his pain however the symptoms came back after about an hour or so. These are occurring now at rest suggesting that this is more significant unstable angina. EKG in the office shows a stable right bundle branch block with no new ischemic changes.  [name] returns today from his recent hospitalization. He was referred in for unstable angina and underwent heart catheterization. This  demonstrated the following:  Procedure Performed:  1. Left Heart Catheterization 2. Selective Coronary Angiography 3. SVG angiography 4. LIMA graft angiography 5. Left ventricular angiogram 6. PTCA/DES x 1 proximal body of SVG to intermediate branch 7. Angioseal right femoral artery  Operator: [name], MD  Indication: 76 yo male with history of 5V CABG in 2003 with most recent cath September 2012 with 4/5 patent grafts (SVG to Diagonal occluded) but noted to have severe disease in native targets with no options for PCI at that time. Recently having chest pain c/w unstable angina.   Procedure Details: The risks, benefits, complications, treatment options, and expected outcomes were discussed with the patient. The patient and/or family concurred with the proposed plan, giving informed consent. The patient was brought to the cath lab after IV hydration was begun and oral premedication was given. The patient was further sedated with Versed and Fentanyl. The right groin was prepped and draped in the usual manner. Using the modified Seldinger access technique, a 5 French sheath was placed in the right femoral artery. Standard diagnostic catheters were used to perform selective coronary angiography. The JR4 catheter was used to engage the RCA, the LIMA graft and all vein grafts. A pigtail catheter was used to perform a left ventricular angiogram. He was found to have severe stenosis in the ostium of the SVG to the intermediate branch. I elected to proceed to PCI of this vein graft.   PCI Note: He was given 600 mg Plavix po x 1. Angiomax weight based bolus given and drip started. When the ACT was over 200, I  engaged the SVG to the intermediate branch with a 6 French LCB guiding catheter. I then passed a Cougar IC wire down the SVG into the target intermediate branch. I did not use a distal protection device given the short distance of the graft to the target. I  also needed to make guide manipulations with the ostial location of the stenosis and did not want to have frequent drift of the distal protection device. A 2.5 x 12 mm balloon was used to pre-dilate the ostial stenosis x 1. I then deployed a 3.0 x 16 mm Promus Premier DES in the ostium of the SVG. The stent was post-dilated x 1 with a 3.25 x 12 mm Gila balloon x 1. The stenosis was taken from 99% down to 10%. He was given 24 mcg IC adenosine. There was excellent flow into the target vessel. Angioseal placed right femoral artery.   There were no immediate complications. The patient was taken to the recovery area in stable condition.   Hemodynamic Findings: Central aortic pressure: 118/58 Left ventricular pressure: 119/6/20  Angiographic Findings:  Left main: 10% ostial stenosis.   Left Anterior Descending Artery: Moderate caliber vessel that courses to the apex. The proximal vessel has diffuse 60-70% stenosis supplying a small diagonal branch. The mid and distal vessel fills antegrade and from the patent IMA graft.   Circumflex Artery: 100% ostial occlusion. The intermediate branch fills from the patent vein graft. The obtuse marginal branch fills from right to left collaterals (through filling of the SVG to the PDA).   Right Coronary Artery: 100% mid occlusion. The PDA and PLA fill from the patent vein graft. There is severe stenosis in the native PDA retrograde from the graft insertion but this is unchanged from last cath in 2012. This leads to the PLA.   Graft Anatomy:  SVG to Diagonal is occluded SVG sequential to intermediate branch and obtuse marginal is patent to the intermediate but occluded distal limb to the obtuse marginal.  SVG to PDA is patent with 50% anastomosis lesion into the PDA (unchanged from last cath) and as noted above, there is severe disease in the PDA in the more proximal portion of the vessel than where the graft inserts. This supplies the PLA retrograde and is  unchanged from last cath. (Unfavorable target for PCI).  LIMA graft to mid LAD is patent. 40% ostial stenosis IMA, unchanged from last cath.   Left Ventricular Angiogram: LVEF=40% with anterior wall hypokinesis. Mild MR.   Impression: 1. 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  2. 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 Mild to moderate LV systolic dysfunction 4. Successful PTCA/DES x 1 ostium of SVG to intermediate branch.   Recommendations: He will need dual anti-platelet therapy with ASA and Plavix for at least one year.    Complications: None. The patient tolerated the procedure well.  Since his percutaneous intervention to the vein graft to the intermediate branch she is doing much better. He denies any further angina. Unfortunately, his main complaint today is that he has pain in the first MTP joint of the right foot with redness and swelling as well as warmth. This has been going on for couple days and is difficult for him to ambulate. He says he's had some episode like this about a year ago in the left foot. He denies any prior diagnosis of gout. He does carry a history of rheumatoid arthritis, the details of which are not totally  clear to me. I'm not sure if he is followed up by a rheumatologist.  Mr. Bian returns today for follow-up. He reports that his gout has resolved. His uric acid level was elevated at 8.0 however he was not back to see his primary care provider for treatment. Unfortunately, an EKG in the office today shows either atypical atrial flutter or coarse atrial fibrillation. This is a new diagnosis for him. I showed him the EKGs and explained atrial fibrillation in great detail including the increased risk for stroke. He seemed to have a difficult time understanding the need to be on anticoagulation. We did discuss the elevated bleeding risk that he may face since he's currently on aspirin and Plavix for a stent that was  placed in February. He seems to be asymptomatic although recently has had some more fatigue which could be attributable to this. He denies any chest pain.   I saw Mr. Yongue back today in the office. Fortunately he has converted back to sinus rhythm. 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 Unfortunately he did not start the blood thinner. He told me he has a strong family history of bleeding and his father almost bled to death on warfarin. He is also had some GI bleeding and is very concerned about the medication. He understands that his CHADSVASC score is 3, therefore increasing his wrist to about 5% annually for stroke. Despite these warnings he is adamant against taking a blood thinner. He understands the stroke could be physically disabling and even lead to death.  04/24/16  Mr. Khatoon returns today for follow-up. He is without complaints at this time. He continues to decline taking anticoagulation because of concerns. He understands that he is at increased stroke risk with his A. fib. He fortunately has not had recurrent A. fib that was not aware of it immediately. EKG shows stable sinus bradycardia with first-degree AV block. On his heart catheterization 1 year ago it was noted that EF was low at 40-45%. This has not been reassessed since that time.  11/30/2016   Mr. Loveday returns today for follow-up. He reports she's recently been having some symptoms he's concerned with being angina. He says with exercising on the treadmill he gets some chest discomfort. It tends to get better with rest. He has had to take nitroglycerin 3 times in the last month. He also has some difficulty swallowing. Occasionally this gives him pressure in his chest. He says food sometimes gets stuck when swallowing however he seems to be able to swallow better with hot liquids. He has a remote history of EGD in the past but is never had a documented stricture or dilatation of the esophagus.  12/28/2016  Mr. Bouknight returns today for  follow-up. At his last office visit I made an increase in his nitroglycerin however this is not significantly improved his angina. He still gets chest pain with exertion and particular with exercise. He says over the past several months his continue to limit his ability to exert himself. Based on this his findings are consistent with unstable angina. He has been intolerant to Ranexa in the past. Options seem to be somewhat limited for medical therapy at this point and I am recommending a cardiac catheterization.  PMHx:  Past Medical History:  Diagnosis Date  . CAD (coronary artery disease)    post bypass in 2003  . Complication of anesthesia   . Dyslipidemia   . GERD (gastroesophageal reflux disease)   . Hypertension   . RA (  rheumatoid arthritis) [facility])     Past Surgical History:  Procedure Laterality Date  . CARDIAC CATHETERIZATION  2012   showed patent grafts with small- vessel disease and distal disease of the native vessels. 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 CARDIAC CATHETERIZATION  11/30/2014   Procedure: LEFT HEART CATH AND CORS/GRAFTS ANGIOGRAPHY;  Surgeon: Kathleene Hazel, MD;  Location[facility] C[facility] LAB;  Service: Cardiovascular;;  . CARDIAC CATHETERIZATION  11/30/2014   Procedure: CORONARY STENT INTERVENTION;  Surgeon: Kathleene Hazel, MD;  Location: [facility] CATH LAB;  Service: Cardiovascular;;  SVG to RI  . CORONARY ANGIOPLASTY WITH STENT PLACEMENT  11/30/2014   PTCA/DES OSLIUM  OF SVG  . CORONARY ARTERY BYPASS GRAFT  01/2002   EF 45%    FAMHx:  Family History  Problem Relation Age of Onset  . Sudden death Mother 65  . Liver disease Maternal Grandmother   . Anuerysm Maternal Grandfather   . Stroke Paternal Grandmother   . Heart attack Paternal Grandfather      SOCHx:   reports that he has never smoked. He has never used smokeless tobacco. He reports that he does not drink alcohol or use drugs.  ALLERGIES:  Allergies  Allergen Reactions  . Ranexa [Ranolazine] Other (See Comments)    [place]    ROS: A comprehensive review of systems was negative.  HOME MEDS: Current Outpatient Prescriptions  Medication Sig Dispense Refill  . atorvastatin (LIPITOR) 40 MG tablet TAKE ONE TABLET BY MOUTH ONCE DAILY 90 tablet 3  . clopidogrel (PLAVIX) 75 MG tablet TAKE ONE TABLET BY MOUTH ONCE DAILY WITH BREAKFAST 90 tablet 3  . isosorbide mononitrate (IMDUR) 60 MG 24 hr tablet Take 1 tablet (60mg ) by mouth in the morning and 1/2 tablet (30mg ) by mouth in the evening. 135 tablet 1  . losartan (COZAAR) 25 MG tablet TAKE ONE TABLET BY MOUTH ONCE DAILY 90 tablet 1  . metoprolol tartrate (LOPRESSOR) 25 MG tablet TAKE ONE-HALF TABLET BY MOUTH TWICE DAILY 90 tablet 3  . nitroGLYCERIN (NITROSTAT) 0.4 MG SL tablet Place 1 tablet (0.4 mg total) under the tongue every 5 (five) minutes as needed for chest pain. 25 tablet 3  . pantoprazole (PROTONIX) 20 MG tablet TAKE ONE TABLET BY MOUTH ONCE DAILY (Patient not taking: Reported on 11/30/2016) 90 tablet 3   No current facility-administered medications for this visit.     LABS/IMAGING: Results for orders placed or performed in visit on 12/28/16 (from the past 48 hour(s))  CBC     Status: None   Collection Time: 12/28/16 11:39 AM  Result Value Ref Range   WBC 5.7 3.8 - 10.8 K/uL   RBC 5.32 4.20 - 5.80 MIL/uL   Hemoglobin 15.6 13.2 - 17.1 g/dL   HCT 16.1 09.6 - 04.5 %   MCV 88.0 80.0 - 100.0 fL   MCH 29.3 27.0 - 33.0 pg   MCHC 33.3 32.0 - 36.0 g/dL   RDW 40.9 81.1 - 91.4 %   Platelets 185 140 - 400 K/uL   MPV 10.7 7.5 - 12.5 fL  Basic metabolic panel     Status: None   Collection Time: 12/28/16 11:39 AM  Result Value Ref Range   Sodium 140 135 - 146 mmol/L   Potassium  4.5 3.5 - 5.3 mmol/L   Chloride 105  98 - 110 mmol/L   CO2 27 20 - 31 mmol/L   Glucose, Bld 92 65 - 99 mg/dL   BUN 14 7 - 25 mg/dL   Creat 8.12 7.51 - 7.00 mg/dL    Comment:   For patients > or = 74 years of age: The upper reference limit for Creatinine is approximately 13% higher for people identified as African-American.      Calcium 9.6 8.6 - 10.3 mg/dL  APTT     Status: None   Collection Time: 12/28/16 11:39 AM  Result Value Ref Range   aPTT 24 22 - 34 sec    Comment:   This test has not been validated for monitoring unfractionated heparin therapy. For testing that is validated for this type of therapy, please refer to the Heparin Anti-Xa assay (test code 17494).   For additional information, please refer to http://education.[facility].com/faq/FAQ159 (This link is being provided for informational/educational purposes only.)     Protime-INR     Status: None   Collection Time: 12/28/16 11:39 AM  Result Value Ref Range   Prothrombin Time 10.7 9.0 - 11.5 sec    Comment:   For more information on this test, go to: http://education.[facility].com/faq/FAQ104      INR 1.0     Comment:   Reference Range                        0.9-1.1 Moderate-intensity Warfarin Therapy    2.0-3.0 Higher-intensity Warfarin Therapy      3.0-4.0     TSH     Status: None   Collection Time: 12/28/16 11:39 AM  Result Value Ref Range   TSH 0.71 0.40 - 4.50 mIU/L   Dg Chest 2 View  Result Date: 12/28/2016 CLINICAL DATA:  Preoperative examination prior to cardiac catheterization next week. History of previous CABG, hyperlipidemia, rheumatoid arthritis, nonsmoker. EXAM: CHEST  2 VIEW COMPARISON:  PA and lateral chest x-ray of November 29, 2014 FINDINGS: The lungs are adequately inflated. There is no focal infiltrate. There is stable post CABG changes. The heart and pulmonary vascularity are normal. The mediastinum is normal in width. The retrosternal soft tissues are normal. There is no pleural effusion. The bony thorax  exhibits no acute abnormality. IMPRESSION: There is no pneumonia nor other acute cardiopulmonary disease. Stable post CABG changes. Electronically Signed   By: David  [place] M.D.   On: 12/28/2016 14:19    VITALS: BP 138/70 (BP Location: Left Arm)   Pulse 74   Ht 6\' 2"  (1.88 m)   Wt 199 lb (90.3 kg)   BMI 25.55 kg/m   EXAM: Deferred  EKG: Sinus bradycardia and first-degree AV block, bifascicular block 52  ASSESSMENT:  1. Unstable angina 2. 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 Status post PCI to the SVG to intermediate branch with a Promus Premier DES (11/2014) 5. CAD s/p CABG x 5 in 2003 6. Dyslipidemia 7. Ischemic cardioymyopathy, EF 40-45% 8. HTN  PLAN: 1.   Mr. Diana is describing symptoms concerning for unstable angina. He's not had any significant improvement with an increase in his nitrates, particularly in the morning but perhaps a little improvement at night. He's avoided a lot of activities due to the fact that he causes him angina. Based on his history and prior stenting in 2016, think is reasonable to consider repeat catheterization.  I did discuss again the risks, benefits and alternatives of cardiac catheterization with him today and he seems agreeable to proceed. We'll try to schedule with Dr. Clifton James again because he performed his prior procedure.  Chrystie Nose, MD, [facility] Attending Cardiologist [facility]  Lisette Abu Mariela Rex 12/29/2016, 6:18 PM

## 2017-01-06 NOTE — Progress Notes (Signed)
TR BAND REMOVAL  LOCATION:    left radial  DEFLATED PER PROTOCOL:    Yes.    TIME BAND OFF / DRESSING APPLIED:    1600   SITE UPON ARRIVAL:    Level 1( Small Oozing,bruised slightly) SITE AFTER BAND REMOVAL:    Level 1 (bruised, soft)  CIRCULATION SENSATION AND MOVEMENT:    Within Normal Limits   Yes.    COMMENTS:   Tolerated procedure well

## 2017-01-06 NOTE — Progress Notes (Signed)
Patient arrived to cath lab holding. Report received from Lenn Sink. Lt Radial site began to ooze. Repositions Radial Band. Small hematoma noted distal and proximal to TR band. Manual pressure held to site for 10 minutes. Level 1 to proximal site. Distal hematoma resolved. Patient states no pain at this time. Total of 36ml in TR band. Will continue to monitor site.

## 2017-01-06 NOTE — Interval H&P Note (Signed)
History and Physical Interval Note:  01/06/2017 8:16 AM  Earl Keller  has presented today for surgery, with the diagnosis of unstable angina  The various methods of treatment have been discussed with the patient and family. After consideration of risks, benefits and other options for treatment, the patient has consented to  Procedure(s): Left Heart Cath and Cors/Grafts Angiography (N/A) as a surgical intervention .  The patient's history has been reviewed, patient examined, no change in status, stable for surgery.  I have reviewed the patient's chart and labs.  Questions were answered to the patient's satisfaction.    Cath Lab Visit (complete for each Cath Lab visit)  Clinical Evaluation Leading to the Procedure:   ACS: No.  Non-ACS:    Anginal Classification: CCS III  Anti-ischemic medical therapy: Maximal Therapy (2 or more classes of medications)  Non-Invasive Test Results: No non-invasive testing performed  Prior CABG: Previous CABG         Earl Keller

## 2017-01-07 ENCOUNTER — Encounter (HOSPITAL_COMMUNITY): Payer: Self-pay

## 2017-01-07 DIAGNOSIS — T82855A Stenosis of coronary artery stent, initial encounter: Secondary | ICD-10-CM | POA: Diagnosis not present

## 2017-01-07 DIAGNOSIS — I2583 Coronary atherosclerosis due to lipid rich plaque: Secondary | ICD-10-CM | POA: Diagnosis not present

## 2017-01-07 DIAGNOSIS — I2510 Atherosclerotic heart disease of native coronary artery without angina pectoris: Secondary | ICD-10-CM | POA: Diagnosis not present

## 2017-01-07 DIAGNOSIS — E785 Hyperlipidemia, unspecified: Secondary | ICD-10-CM | POA: Diagnosis not present

## 2017-01-07 DIAGNOSIS — I25710 Atherosclerosis of autologous vein coronary artery bypass graft(s) with unstable angina pectoris: Secondary | ICD-10-CM | POA: Diagnosis not present

## 2017-01-07 DIAGNOSIS — I2582 Chronic total occlusion of coronary artery: Secondary | ICD-10-CM | POA: Diagnosis not present

## 2017-01-07 DIAGNOSIS — E782 Mixed hyperlipidemia: Secondary | ICD-10-CM

## 2017-01-07 DIAGNOSIS — I480 Paroxysmal atrial fibrillation: Secondary | ICD-10-CM | POA: Diagnosis not present

## 2017-01-07 DIAGNOSIS — I200 Unstable angina: Secondary | ICD-10-CM

## 2017-01-07 DIAGNOSIS — I25110 Atherosclerotic heart disease of native coronary artery with unstable angina pectoris: Secondary | ICD-10-CM | POA: Diagnosis not present

## 2017-01-07 DIAGNOSIS — Z951 Presence of aortocoronary bypass graft: Secondary | ICD-10-CM | POA: Diagnosis not present

## 2017-01-07 LAB — CBC
HEMATOCRIT: 45.2 % (ref 39.0–52.0)
HEMOGLOBIN: 15 g/dL (ref 13.0–17.0)
MCH: 29.1 pg (ref 26.0–34.0)
MCHC: 33.2 g/dL (ref 30.0–36.0)
MCV: 87.8 fL (ref 78.0–100.0)
Platelets: 200 10*3/uL (ref 150–400)
RBC: 5.15 MIL/uL (ref 4.22–5.81)
RDW: 13.4 % (ref 11.5–15.5)
WBC: 7.6 10*3/uL (ref 4.0–10.5)

## 2017-01-07 LAB — BASIC METABOLIC PANEL
ANION GAP: 8 (ref 5–15)
BUN: 12 mg/dL (ref 6–20)
CALCIUM: 9.4 mg/dL (ref 8.9–10.3)
CO2: 27 mmol/L (ref 22–32)
CREATININE: 1.13 mg/dL (ref 0.61–1.24)
Chloride: 104 mmol/L (ref 101–111)
Glucose, Bld: 98 mg/dL (ref 65–99)
Potassium: 4.2 mmol/L (ref 3.5–5.1)
SODIUM: 139 mmol/L (ref 135–145)

## 2017-01-07 NOTE — Progress Notes (Signed)
CARDIAC REHAB PHASE I   PRE:  Rate/Rhythm: 55 SB  BP:  Supine:   Sitting: 138/74  Standing:    SaO2:   MODE:  Ambulation: 800 ft   POST:  Rate/Rhythm: 62 SR  BP:  Supine:   Sitting: 173/73  Standing:    SaO2:  [id number] Pt walked 800 ft with steady gait. No CP. Tolerated well. Education completed with pt and wife who voiced understanding. Stressed importance of plavix with stent. Pt is active with walking and has attended CRP 2 before in [place]. Will refer to [facility] program but pt doubts he will attend. Wants to continue walking for ex on his own.   Luetta Nutting, RN BSN  01/07/2017 8:42 AM

## 2017-01-07 NOTE — Discharge Summary (Signed)
Discharge Summary    Patient ID: Earl Keller,  MRN: [id number], DOB/AGE: 31-Dec-1940 76 y.o.  Admit date: 01/06/2017 Discharge date: 01/07/2017  Primary Care Provider: Maryjean Ka Primary Cardiologist: Dr. Rennis Golden    Discharge Diagnoses    Principal Problem:   Unstable angina [facility]) Active Problems:   Coronary artery disease   Dyslipidemia   GERD (gastroesophageal reflux disease)   Gout   PAF (paroxysmal atrial fibrillation) (HCC)   Hyperlipidemia   Allergies Allergies  Allergen Reactions  . Ranexa [Ranolazine] Other (See Comments)    Vivid Dreams     History of Present Illness     Earl Keller is a 76 y.o. male with a history of CAD s/p CABG x5V (2003) and DES to ostial SVG--> intermediate (2016), PAF (declines OAC), GI bleed (2011), HTN, HLD and GERD who presented to [facility] on 01/06/17 for planned cardiac cath for evaluation of chest pain concerning for [place].  He has a history of CAD s/p  CABG x5 in 2003 by Dr. Tyrone Sage (LIMA--> LAD, SVG--> diag, SVG--> RI, SVG--> LCx, and SVG--> PDA).  In 06/2011 he had a NSTEMI and catheterization at that time 4/5 patent grafts (SVG to Diagonal occluded) but noted to have severe disease in native targets with no options for PCI at that time. The ejection fraction that time was 45%. He was placed on Ranexa but was intolerant to this due to vivid dreams and it was discontinued.   He underwent LHC in 11/2014 for [place] which showed severe triple vessel CAD s/p 5V CABG with 3/5 patent bypass grafts. He underwent DES to ostium of SVG--> intermediate branch.  In the interm he was found to have asymptomatic atrial fib vs course flutter during a routine office visit with Dr. Rennis Golden, but patient refused oral anticoagulation because he has a strong family history of bleeding and his father almost bled to death on warfarin."   2D ECHO in 04/2016 showed EF had normalized to 60-65%.   He was recently seen in the office by Dr. Rennis Golden in the office  in 11/2016 for worsening exertional chest pain. His imdur was increased but this did not significantly improve symptoms. He was seen back in the office on 12/28/16 and reported chest pain was limiting his ability to exert himself. His symptoms were felt worrisome for [place] and he was set up for diagnostic coronary angiography with Dr. Clifton James on 01/06/17.    Hospital Course     Consultants: none  [place]/CAD: LHC on 01/06/17 showed severe 3V CAD s/p 5V CABG with 3/5 patent bypass grafts. There was severe stenosis of distal SVG at anastomosis of PDA s/p DES and severe stenosis of mid body SVG to intermediate branch s/p PCTA. Plan to continue DAPT with ASA/Plavix for at least 1 year. Continue protonix for GI protection.   HTN: BP well controlled this admission.   HLD: continue statin   PAF: refuses OAC despite CHADSVSASC of at least 4 (CHF, HTN, age, vasc dz). Maintaining NSR this admission.     The patient has had an uncomplicated hospital course and is recovering well. The radial catheter site is stable. He has been seen by Dr. Rennis Golden today and deemed ready for discharge home. All follow-up appointments have been scheduled. Discharge medications are listed below.  _____________  Discharge Vitals  Blood pressure (!) 151/73, pulse (!) 55, temperature 98.1 F (36.7 C), temperature source Oral, resp. rate 16, height 6\' 2"  (1.88 m), weight 198 lb 6.6 oz (  90 kg), SpO2 97 %.  Filed Weights   01/06/17 7209 01/07/17 0614  Weight: 199 lb (90.3 kg) 198 lb 6.6 oz (90 kg)   GEN: Well nourished, well developed, in no acute distress  HEENT: normal  Neck: no JVD, carotid bruits, or masses Cardiac: RRR; no murmurs, rubs, or gallops,no edema  Respiratory:  clear to auscultation bilaterally, normal work of breathing GI: soft, nontender, nondistended, + BS MS: no deformity or atrophy  Skin: warm and dry, no rash Neuro:  Alert and Oriented x 3, Strength and sensation are intact Psych: euthymic mood, full  affect    Labs & Radiologic Studies     CBC  Recent Labs  01/07/17 0242  WBC 7.6  HGB 15.0  HCT 45.2  MCV 87.8  PLT 200   Basic Metabolic Panel  Recent Labs  01/07/17 0242  NA 139  K 4.2  CL 104  CO2 27  GLUCOSE 98  BUN 12  CREATININE 1.13  CALCIUM 9.4    Dg Chest 2 View  Result Date: 12/28/2016 CLINICAL DATA:  Preoperative examination prior to cardiac catheterization next week. History of previous CABG, hyperlipidemia, rheumatoid arthritis, nonsmoker. EXAM: CHEST  2 VIEW COMPARISON:  PA and lateral chest x-ray of November 29, 2014 FINDINGS: The lungs are adequately inflated. There is no focal infiltrate. There is stable post CABG changes. The heart and pulmonary vascularity are normal. The mediastinum is normal in width. The retrosternal soft tissues are normal. There is no pleural effusion. The bony thorax exhibits no acute abnormality. IMPRESSION: There is no pneumonia nor other acute cardiopulmonary disease. Stable post CABG changes. Electronically Signed   By: David  [place] M.D.   On: 12/28/2016 14:19     Diagnostic Studies/Procedures    01/06/17 Coronary Stent Intervention  Left Heart Cath and Cors/Grafts Angiography  Conclusion   1. Severe triple vessel CAD s/p 5V CABG with 3/5 patent bypass grafts 2. Severe stenosis distal body of SVG at anastomosis to the PDA 3. Severe stenosis mid body of SVG to intermediate branch 4. Chronic occlusion mid LAD. Distal vessel fills from the patent LIMA graft.  5. Chronic occlusion circumflex/intermediate. Intermediate fills from the patent vein graft. As above the vein graft has a severe mid stenosis and moderate ostial stent restenosis.  6. Chronic occlusion mid RCA. The PDA and PLA fill from the patent vein graft. There is a severe stenosis at the anastomosis to the PDA. The PLA has a moderate stenosis.  7. Successful PTCA/DES x 1 mid body of SVG to intermediate. Successful angioplasty only ostial stent in the body of the  vein graft.  8. Successful PTCA/DES x 1 distal body of SVG to PDA.   Recommendations: Continue DAPT with ASA and Plavix for at least one year. Continue statin, beta blocker, ARB and Imdur. Home in am if stable.     _____________    Disposition   Pt is being discharged home today in good condition.  Follow-up Plans & Appointments    Follow-up Information    Azalee Course, [place] Follow up on 01/18/2017.   Specialties:  Cardiology, Radiology Why:  @ 8:30am. please arrive at least 10 minutes early Contact information: [address] [place] [address] [phone]          Discharge Instructions    Amb Referral to Cardiac Rehabilitation    Complete by:  As directed    Referring to [facility] program   Diagnosis:  Coronary Stents  Discharge Medications     Medication List    TAKE these medications   aspirin EC 81 MG tablet Take 81 mg by mouth daily.   atorvastatin 40 MG tablet Commonly known as:  LIPITOR TAKE ONE TABLET BY MOUTH ONCE DAILY What changed:  See the new instructions.   clopidogrel 75 MG tablet Commonly known as:  PLAVIX TAKE ONE TABLET BY MOUTH ONCE DAILY WITH BREAKFAST   isosorbide mononitrate 60 MG 24 hr tablet Commonly known as:  IMDUR Take 1 tablet (60mg ) by mouth in the morning and 1/2 tablet (30mg ) by mouth in the evening.   losartan 25 MG tablet Commonly known as:  COZAAR TAKE ONE TABLET BY MOUTH ONCE DAILY   metoprolol tartrate 25 MG tablet Commonly known as:  LOPRESSOR TAKE ONE-HALF TABLET BY MOUTH TWICE DAILY What changed:  See the new instructions.   nitroGLYCERIN 0.4 MG SL tablet Commonly known as:  NITROSTAT Place 1 tablet (0.4 mg total) under the tongue every 5 (five) minutes as needed for chest pain.   pantoprazole 20 MG tablet Commonly known as:  PROTONIX TAKE ONE TABLET BY MOUTH ONCE DAILY         Outstanding Labs/Studies   none  Duration of Discharge Encounter   Greater than 30 minutes  including physician time.  Signed, PA-C 01/07/2017, 11:40 AM

## 2017-01-07 NOTE — Discharge Instructions (Signed)

## 2017-01-07 NOTE — Care Management Note (Signed)
Case Management Note  Patient Details  Name: Earl Keller MRN: [id number] Date of Birth: 02-18-1941  Subjective/Objective:  s/p coronary stent intervention, will be on plavix and asa.  For dc today , no needs.                  Action/Plan:   Expected Discharge Date:  01/07/17               Expected Discharge Plan:  Home/Self Care  In-House Referral:     Discharge planning Services  CM Consult  Post Acute Care Choice:    Choice offered to:     DME Arranged:    DME Agency:     HH Arranged:    HH Agency:     Status of Service:  Completed, signed off  If discussed at [employer] of Stay Meetings, dates discussed:    Additional Comments:  Leone Haven, RN 01/07/2017, 12:58 PM

## 2017-01-18 ENCOUNTER — Encounter: Payer: Self-pay | Admitting: Physician Assistant

## 2017-01-18 ENCOUNTER — Ambulatory Visit (INDEPENDENT_AMBULATORY_CARE_PROVIDER_SITE_OTHER): Payer: Medicare Other | Admitting: Physician Assistant

## 2017-01-18 VITALS — BP 138/70 | HR 48 | Ht 74.0 in | Wt 197.0 lb

## 2017-01-18 DIAGNOSIS — E785 Hyperlipidemia, unspecified: Secondary | ICD-10-CM

## 2017-01-18 DIAGNOSIS — I25810 Atherosclerosis of coronary artery bypass graft(s) without angina pectoris: Secondary | ICD-10-CM

## 2017-01-18 DIAGNOSIS — I480 Paroxysmal atrial fibrillation: Secondary | ICD-10-CM | POA: Diagnosis not present

## 2017-01-18 DIAGNOSIS — I10 Essential (primary) hypertension: Secondary | ICD-10-CM

## 2017-01-18 MED ORDER — ISOSORBIDE MONONITRATE ER 60 MG PO TB24: ORAL_TABLET | ORAL | 1 refills | Status: DC

## 2017-01-18 NOTE — Patient Instructions (Signed)
Medication Instructions:  CHANGE IMDUR 60 MG DAILY   If you need a refill on your cardiac medications before your next appointment, please call your pharmacy.  Labwork: LAB WITH PCP-RECOMMEND FASTING LIPID PANEL PLEASE HAVE PCP FAX TO [phone]  Follow-Up: Your physician wants you to follow-up in: 3 MONTHS WITH DR HILTY.   Special Instructions: PLEASE MONITOR FOR FATIGUE OR DIZZINESS IF CONTINUOUS PLEASE CONTACT OUR OFFICE    Thank you for choosing [facility] [place]!!    HAO MENG, PA-C [place], LPN

## 2017-01-18 NOTE — Progress Notes (Signed)
Cardiology Office Note    Date:  01/18/2017   ID:  Reyniel, [place] Jul 07, 1941, MRN [id number]  PCP:  Maryjean Ka, MD  Cardiologist:  Dr. Rennis Golden  Chief Complaint  Patient presents with  . Follow-up    post cath. Seen for Dr. Rennis Golden    History of Present Illness:  Earl Keller is a 76 y.o. male with PMH of HLD, HTN, PAF not on systemic anticoagulation, h/o upper GI bleed, and CAD s/p CABG x 5 in 2003. Based on previous note, it appears he has refused OAC despite having CHADSVASC of at least 4 (CHF, HTN, age, vascular dz). He had known occluded SVG to diagonal since 2012. Although ejection fraction was 45% the time, later repeat echocardiogram by July 2017 showed normalization of EF to 60-65%. He developed unstable angina in February 2016 and underwent DES to SVG to intermediate. More recently, patient was seen by Dr. Rennis Golden in March 2018, he was noted to have recurrent dull chest pain especially noticeable with exertion. He has been intolerant to Ranexa in the past. Unfortunately despite increasing his nitroglycerin, he continued to have symptom. Therefore he was referred for repeat cardiac catheterization on 01/06/2017 which showed patency of 3 out of 5 bypass graft, chronic occlusion to mid LAD, chronically occluded left circumflex and intermediate, chronic occlusion of mid RCA, severe stenosis in the distal body of SVG to PDA treated with drug-eluting stent, severe stenosis in the mid body of SVG to intermediate branch also treated with drug-eluting stents. He is to continue aspirin and Plavix for at least one year.  He presents today for post hospital follow-up. He has been doing well from cardiology perspective. He was able to walk up to 40 minutes yesterday without any recurrent chest discomfort. Today's EKG does show some bradycardia with heart rate of 48, looking back, his heart rate was 52 back in February. He is asymptomatic from slow heart rate. He is essentially on the lowest  possible dose of metoprolol, and has been on this medication for the past 7-8 years. I will continue on the current medication. Since his recent cath, he did discontinue the nighttime dose of Imdur as he no longer have recurrent chest pain on daily basis. I am fine with this and instructed him to continue on 60 mg Imdur every morning. His last lipid panel was in June 2017, it was very well at the time. He has upcoming appointment with his primary care physician, I recommended him to have a repeat lipid panel and fax the result to [place] at that time. Otherwise today's EKG showed no obvious sign of ischemia. He can follow-up with Dr. Rennis Golden in 3 month.   Past Medical History:  Diagnosis Date  . CAD (coronary artery disease)    a. 2003: s/p CABG x5V  b. 06/2011: NSTEMI, 4/5 patent grafts (SVG to Diag occluded)- no good PCI targets.  c. 11/2014: [place]: s/p DES to SVG--> intermed    d. 12/2016: [place] s/p DES to SVG--> PDA and PCTA to SVG--> intermed  . Complication of anesthesia    "came to before they took the ETT out after OHS"  . Dyslipidemia   . GERD (gastroesophageal reflux disease)   . History of blood transfusion 06/2010   related to "stomach bleeding"  . History of gout 11/2014  . Hypertension   . NSTEMI (non-ST elevated myocardial infarction) (HCC) 06/2011   Hattie Perch 07/23/2011  . PAF (paroxysmal atrial fibrillation) (HCC)   . RA (rheumatoid  arthritis) (HCC)    "a little in my hands" (01/06/2017)  . Trouble swallowing    "comes and goes" (01/06/2017)  . Upper GI bleed 07/2010   "went in w/a scope and clipped a bleeding vessel"    Past Surgical History:  Procedure Laterality Date  . CARDIAC CATHETERIZATION  2012   showed patent grafts with small- vessel disease and distal disease of the native vessels. There is also some osteal narrowing of his internal mammary artery and the vein graft to the OM and the distal anastomotic site of the vein to the RCA, there was nothing that needed to be intervened  upon medically, his EF was still around 45%  . CARDIAC CATHETERIZATION  11/30/2014   Procedure: LEFT HEART CATH AND CORS/GRAFTS ANGIOGRAPHY;  Surgeon: Kathleene Hazel, MD;  Location: [facility] CATH LAB;  Service: Cardiovascular;;  . CARDIAC CATHETERIZATION  11/30/2014   Procedure: CORONARY STENT INTERVENTION;  Surgeon: Kathleene Hazel, MD;  Location: [facility] CATH LAB;  Service: Cardiovascular;;  SVG to RI  . CORONARY ANGIOPLASTY WITH STENT PLACEMENT  11/30/2014   PTCA/DES OSLIUM  OF SVG  . CORONARY ANGIOPLASTY WITH STENT PLACEMENT  01/06/2017  . CORONARY ARTERY BYPASS GRAFT  01/2002   EF 45%; "before bypass"  . CORONARY STENT INTERVENTION N/A 01/06/2017   Procedure: Coronary Stent Intervention;  Surgeon: Kathleene Hazel, MD;  Location: [facility] INVASIVE CV LAB;  Service: Cardiovascular;  Laterality: N/A;  . LEFT HEART CATH AND CORS/GRAFTS ANGIOGRAPHY N/A 01/06/2017   Procedure: Left Heart Cath and Cors/Grafts Angiography;  Surgeon: Kathleene Hazel, MD;  Location: [facility] INVASIVE CV LAB;  Service: Cardiovascular;  Laterality: N/A;  . TONSILLECTOMY      Current Medications: Outpatient Medications Prior to Visit  Medication Sig Dispense Refill  . aspirin EC 81 MG tablet Take 81 mg by mouth daily.    Marland Kitchen atorvastatin (LIPITOR) 40 MG tablet TAKE ONE TABLET BY MOUTH ONCE DAILY (Patient taking differently: Take 40mg s by mouth daily at night) 90 tablet 3  . clopidogrel (PLAVIX) 75 MG tablet TAKE ONE TABLET BY MOUTH ONCE DAILY WITH BREAKFAST 90 tablet 3  . losartan (COZAAR) 25 MG tablet TAKE ONE TABLET BY MOUTH ONCE DAILY 90 tablet 3  . metoprolol tartrate (LOPRESSOR) 25 MG tablet TAKE ONE-HALF TABLET BY MOUTH TWICE DAILY (Patient taking differently: Take 12.5mg  by mouth twice daily) 90 tablet 3  . nitroGLYCERIN (NITROSTAT) 0.4 MG SL tablet Place 1 tablet (0.4 mg total) under the tongue every 5 (five) minutes as needed for chest pain. 25 tablet 3  . isosorbide mononitrate (IMDUR) 60 MG 24 hr tablet  Take 1 tablet (60mg ) by mouth in the morning and 1/2 tablet (30mg ) by mouth in the evening. 135 tablet 1  . pantoprazole (PROTONIX) 20 MG tablet TAKE ONE TABLET BY MOUTH ONCE DAILY (Patient not taking: Reported on 01/18/2017) 90 tablet 3   No facility-administered medications prior to visit.      Allergies:   Ranexa [ranolazine]   Social History   Social History  . Marital status: Married    Spouse name: N/A  . Number of children: N/A  . Years of education: N/A   Social History Main Topics  . Smoking status: Never Smoker  . Smokeless tobacco: Never Used  . Alcohol use No  . Drug use: No  . Sexual activity: Not Asked   Other Topics Concern  . None   Social History Narrative  . None     Family History:  The patient's  family history includes Anuerysm in his maternal grandfather; Heart attack in his paternal grandfather; Liver disease in his maternal grandmother; Stroke in his paternal grandmother; Sudden death (age of onset: 60) in his mother.   ROS:   Please see the history of present illness.    ROS All other systems reviewed and are negative.   PHYSICAL EXAM:   VS:  BP 138/70   Pulse (!) 48   Ht 6\' 2"  (1.88 m)   Wt 197 lb (89.4 kg)   BMI 25.29 kg/m    GEN: Well nourished, well developed, in no acute distress  HEENT: normal  Neck: no JVD, carotid bruits, or masses Cardiac: RRR; no murmurs, rubs, or gallops,no edema  Respiratory:  clear to auscultation bilaterally, normal work of breathing GI: soft, nontender, nondistended, + BS MS: no deformity or atrophy  Skin: warm and dry, no rash Neuro:  Alert and Oriented x 3, Strength and sensation are intact Psych: euthymic mood, full affect  Wt Readings from Last 3 Encounters:  01/18/17 197 lb (89.4 kg)  01/07/17 198 lb 6.6 oz (90 kg)  12/28/16 199 lb (90.3 kg)      Studies/Labs Reviewed:   EKG:  EKG is ordered today.  The ekg ordered today demonstrates sinus bradycardia, first-degree A-V block, right bundle  branch block, single PVC, otherwise no significant ST-T wave changes.  Recent Labs: 03/26/2016: ALT 20 12/28/2016: TSH 0.71 01/07/2017: BUN 12; Creatinine, Ser 1.13; Hemoglobin 15.0; Platelets 200; Potassium 4.2; Sodium 139   Lipid Panel    Component Value Date/Time   CHOL 135 03/26/2016 1131   TRIG 147 03/26/2016 1131   HDL 38 (L) 03/26/2016 1131   CHOLHDL 3.6 03/26/2016 1131   VLDL 29 03/26/2016 1131   LDLCALC 68 03/26/2016 1131    Additional studies/ records that were reviewed today include:   Echo 04/30/2016 LV EF: 60% -   65%  - Technical notes: Patient refused definity. - Procedure narrative: Transthoracic echocardiography for left   ventricular function evaluation, for right ventricular function   evaluation, and for assessment of valvular function. Image   quality was adequate. - Left ventricle: The cavity size was normal. Wall thickness was   normal. Systolic function was normal. The estimated ejection   fraction was in the range of 60% to 65%. Wall motion was normal;   there were no regional wall motion abnormalities. The study is   not technically sufficient to allow evaluation of LV diastolic   function. - Mitral valve: Sclerotic leaflet tips. Trivial regurgitation. - Left atrium: The atrium was normal in size. - Right ventricle: The cavity size was normal. Systolic function is   reduced. - Right atrium: The atrium was normal in size. - Tricuspid valve: There was no significant regurgitation. - Inferior vena cava: The vessel was normal in size. The   respirophasic diameter changes were in the normal range (= 50%),   consistent with normal central venous pressure.  Impressions:  - Technically difficult study. LVEF 60-65%, normal wall thickness,   grossly normal LV wall motion, reduced RV systolic function,   normal LA size, normal IVC.   Cath 01/06/2017 Conclusion   1. Severe triple vessel CAD s/p 5V CABG with 3/5 patent bypass grafts 2. Severe stenosis  distal body of SVG at anastomosis to the PDA 3. Severe stenosis mid body of SVG to intermediate branch 4. Chronic occlusion mid LAD. Distal vessel fills from the patent LIMA graft.  5. Chronic occlusion circumflex/intermediate. Intermediate fills from the  patent vein graft. As above the vein graft has a severe mid stenosis and moderate ostial stent restenosis.  6. Chronic occlusion mid RCA. The PDA and PLA fill from the patent vein graft. There is a severe stenosis at the anastomosis to the PDA. The PLA has a moderate stenosis.  7. Successful PTCA/DES x 1 mid body of SVG to intermediate. Successful angioplasty only ostial stent in the body of the vein graft.  8. Successful PTCA/DES x 1 distal body of SVG to PDA.   Recommendations: Continue DAPT with ASA and Plavix for at least one year. Continue statin, beta blocker, ARB and Imdur. Home in am if stable.     ASSESSMENT:    1. Coronary artery disease involving coronary bypass graft of native heart without angina pectoris   2. Essential hypertension   3. Hyperlipidemia, unspecified hyperlipidemia type   4. PAF (paroxysmal atrial fibrillation) (HCC)      PLAN:  In order of problems listed above:  1. CAD s/p CABG x 5: 3/5 grafts patent on recent cardiac catheterization, he did receive drug-eluting stent placement to mid SVG to intermediate graft and another drug-eluting stent to the distal SVG to PDA. We have emphasized on compliance with dual antiplatelet therapy. Otherwise he has been ambulating up to 40 minutes yesterday without any exertional symptom. He has been doing quite well after recent cardiac catheterization.   2. Hypertension: Blood pressure stable, mildly elevated at 138/70 today. Given his advanced age, I would not be too aggressive in controlling the blood pressure. If BP remains elevated on followup, may consider increase losartan to 50 mg daily. I would not try to increase the metoprolol given her baseline  bradycardia.  3. Hyperlipidemia: Continue on Lipitor 40 mg daily. I recommended for him to repeat a fasting lipid panel when he sees his primary care physician and have the results sent to [place]. Total panel obtained in 2017 showed well-controlled lipid  4. PAF: he is not on systemic anticoagulation, it appears he has previously refused it despite high CHA2DS2-Vasc score. Currently in sinus rhythm based on EKG and the physical exam.   Medication Adjustments/Labs and Tests Ordered: Current medicines are reviewed at length with the patient today.  Concerns regarding medicines are outlined above.  Medication changes, Labs and Tests ordered today are listed in the Patient Instructions below. Patient Instructions  Medication Instructions:  CHANGE IMDUR 60 MG DAILY   If you need a refill on your cardiac medications before your next appointment, please call your pharmacy.  Labwork: LAB WITH PCP-RECOMMEND FASTING LIPID PANEL PLEASE HAVE PCP FAX TO [phone]  Follow-Up: Your physician wants you to follow-up in: 3 MONTHS WITH DR HILTY.   Special Instructions: PLEASE MONITOR FOR FATIGUE OR DIZZINESS IF CONTINUOUS PLEASE CONTACT OUR OFFICE    Thank you for choosing [facility]!!    Dnya Hickle, PA-C [place], LPN     Ramond Dial, [place]  01/18/2017 11:08 AM    [facility] [address] [place], [place], [place]  [address] Phone: [phone]; Fax: [phone]

## 2017-04-20 ENCOUNTER — Ambulatory Visit: Payer: Medicare Other | Admitting: Internal Medicine

## 2017-04-20 ENCOUNTER — Ambulatory Visit (INDEPENDENT_AMBULATORY_CARE_PROVIDER_SITE_OTHER): Payer: Medicare Other | Admitting: Internal Medicine

## 2017-04-20 VITALS — BP 146/76 | HR 52 | Ht 74.0 in | Wt 192.2 lb

## 2017-04-20 DIAGNOSIS — I480 Paroxysmal atrial fibrillation: Secondary | ICD-10-CM | POA: Diagnosis not present

## 2017-04-20 DIAGNOSIS — E785 Hyperlipidemia, unspecified: Secondary | ICD-10-CM | POA: Diagnosis not present

## 2017-04-20 DIAGNOSIS — I25810 Atherosclerosis of coronary artery bypass graft(s) without angina pectoris: Secondary | ICD-10-CM

## 2017-04-20 DIAGNOSIS — Z951 Presence of aortocoronary bypass graft: Secondary | ICD-10-CM | POA: Diagnosis not present

## 2017-04-20 MED ORDER — ISOSORBIDE MONONITRATE ER 60 MG PO TB24: ORAL_TABLET | ORAL | 1 refills | Status: DC

## 2017-04-20 NOTE — Progress Notes (Signed)
OFFICE NOTE  Chief Complaint:  Chest pain, difficulty swallowing  Primary Care Physician: Street, Stephanie Coup, MD  HPI:  Earl Keller is a 76 y.o. male with a history of coronary artery bypass grafting in 2003. In 2012 and non-ST elevation myocardial infarction and catheterization at that time showed patent grafts with small vessel disease and distal disease of native vessels. The ejection fraction that time was 45%. The patient has been doing well on medical therapy with no complaints of angina. He states he is quite active does some hiking and biking fairly regularly without any concerns. He currently denies nausea, vomiting, fever, chest pain, shortness of breath, orthopnea, dizziness, PND, cough, congestion, abdominal pain, hematochezia, melena, lower extremity edema, claudication.  He was previously on Ranexa and isosorbide.   I saw Earl Keller back in the office today. He reports that since January he's been developing a dull aching chest pain. The symptoms came on when walking after about a mile and improved a little bit. Recently he's been having symptoms that are lasting more during a walk and eventually has gotten to a point now where he cannot go on longer walks. He called the office and was restarted on his indoor and continues to have chest pain. He's had 2 episodes of chest discomfort in the past 2 days were taken nitroglycerin which provided some relief. We did note that his nitroglycerin as it is somewhat expired however does burn minimally under his tongue. He reported that initially had improvement in his pain however the symptoms came back after about an hour or so. These are occurring now at rest suggesting that this is more significant unstable angina. EKG in the office shows a stable right bundle branch block with no new ischemic changes.  Earl Keller returns today from his recent hospitalization. He was referred in for unstable angina and underwent heart catheterization. This  demonstrated the following:  Procedure Performed:  1. Left Heart Catheterization 2. Selective Coronary Angiography 3. SVG angiography 4. LIMA graft angiography 5. Left ventricular angiogram 6. PTCA/DES x 1 proximal body of SVG to intermediate branch 7. Angioseal right femoral artery  Operator: [name], MD  Indication: 76 yo male with history of 5V CABG in 2003 with most recent cath September 2012 with 4/5 patent grafts (SVG to Diagonal occluded) but noted to have severe disease in native targets with no options for PCI at that time. Recently having chest pain c/w unstable angina.   Procedure Details: The risks, benefits, complications, treatment options, and expected outcomes were discussed with the patient. The patient and/or family concurred with the proposed plan, giving informed consent. The patient was brought to the cath lab after IV hydration was begun and oral premedication was given. The patient was further sedated with Versed and Fentanyl. The right groin was prepped and draped in the usual manner. Using the modified Seldinger access technique, a 5 French sheath was placed in the right femoral artery. Standard diagnostic catheters were used to perform selective coronary angiography. The JR4 catheter was used to engage the RCA, the LIMA graft and all vein grafts. A pigtail catheter was used to perform a left ventricular angiogram. He was found to have severe stenosis in the ostium of the SVG to the intermediate branch. I elected to proceed to PCI of this vein graft.   PCI Note: He was given 600 mg Plavix po x 1. Angiomax weight based bolus given and drip started. When the ACT was over 200, I  engaged the SVG to the intermediate branch with a 6 French LCB guiding catheter. I then passed a Cougar IC wire down the SVG into the target intermediate branch. I did not use a distal protection device given the short distance of the graft to the target. I  also needed to make guide manipulations with the ostial location of the stenosis and did not want to have frequent drift of the distal protection device. A 2.5 x 12 mm balloon was used to pre-dilate the ostial stenosis x 1. I then deployed a 3.0 x 16 mm Promus Premier DES in the ostium of the SVG. The stent was post-dilated x 1 with a 3.25 x 12 mm Freeland balloon x 1. The stenosis was taken from 99% down to 10%. He was given 24 mcg IC adenosine. There was excellent flow into the target vessel. Angioseal placed right femoral artery.   There were no immediate complications. The patient was taken to the recovery area in stable condition.   Hemodynamic Findings: Central aortic pressure: 118/58 Left ventricular pressure: 119/6/20  Angiographic Findings:  Left main: 10% ostial stenosis.   Left Anterior Descending Artery: Moderate caliber vessel that courses to the apex. The proximal vessel has diffuse 60-70% stenosis supplying a small diagonal branch. The mid and distal vessel fills antegrade and from the patent IMA graft.   Circumflex Artery: 100% ostial occlusion. The intermediate branch fills from the patent vein graft. The obtuse marginal branch fills from right to left collaterals (through filling of the SVG to the PDA).   Right Coronary Artery: 100% mid occlusion. The PDA and PLA fill from the patent vein graft. There is severe stenosis in the native PDA retrograde from the graft insertion but this is unchanged from last cath in 2012. This leads to the PLA.   Graft Anatomy:  SVG to Diagonal is occluded SVG sequential to intermediate branch and obtuse marginal is patent to the intermediate but occluded distal limb to the obtuse marginal.  SVG to PDA is patent with 50% anastomosis lesion into the PDA (unchanged from last cath) and as noted above, there is severe disease in the PDA in the more proximal portion of the vessel than where the graft inserts. This supplies the PLA retrograde and is  unchanged from last cath. (Unfavorable target for PCI).  LIMA graft to mid LAD is patent. 40% ostial stenosis IMA, unchanged from last cath.   Left Ventricular Angiogram: LVEF=40% with anterior wall hypokinesis. Mild MR.   Impression: 1. 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  2. 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 Mild to moderate LV systolic dysfunction 4. Successful PTCA/DES x 1 ostium of SVG to intermediate branch.   Recommendations: He will need dual anti-platelet therapy with ASA and Plavix for at least one year.    Complications: None. The patient tolerated the procedure well.  Since his percutaneous intervention to the vein graft to the intermediate branch she is doing much better. He denies any further angina. Unfortunately, his main complaint today is that he has pain in the first MTP joint of the right foot with redness and swelling as well as warmth. This has been going on for couple days and is difficult for him to ambulate. He says he's had some episode like this about a year ago in the left foot. He denies any prior diagnosis of gout. He does carry a history of rheumatoid arthritis, the details of which are not totally  clear to me. I'm not sure if he is followed up by a rheumatologist.  Earl Keller returns today for follow-up. He reports that his gout has resolved. His uric acid level was elevated at 8.0 however he was not back to see his primary care provider for treatment. Unfortunately, an EKG in the office today shows either atypical atrial flutter or coarse atrial fibrillation. This is a new diagnosis for him. I showed him the EKGs and explained atrial fibrillation in great detail including the increased risk for stroke. He seemed to have a difficult time understanding the need to be on anticoagulation. We did discuss the elevated bleeding risk that he may face since he's currently on aspirin and Plavix for a stent that was  placed in February. He seems to be asymptomatic although recently has had some more fatigue which could be attributable to this. He denies any chest pain.   I saw Earl Keller back today in the office. Fortunately he has converted back to sinus rhythm. 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 Unfortunately he did not start the blood thinner. He told me he has a strong family history of bleeding and his father almost bled to death on warfarin. He is also had some GI bleeding and is very concerned about the medication. He understands that his CHADSVASC score is 3, therefore increasing his wrist to about 5% annually for stroke. Despite these warnings he is adamant against taking a blood thinner. He understands the stroke could be physically disabling and even lead to death.  03-29-16  Earl Keller returns today for follow-up. He is without complaints at this time. He continues to decline taking anticoagulation because of concerns. He understands that he is at increased stroke risk with his A. fib. He fortunately has not had recurrent A. fib that was not aware of it immediately. EKG shows stable sinus bradycardia with first-degree AV block. On his heart catheterization 1 year ago it was noted that EF was low at 40-45%. This has not been reassessed since that time.  11/30/2016   Earl Keller returns today for follow-up. He reports she's recently been having some symptoms he's concerned with being angina. He says with exercising on the treadmill he gets some chest discomfort. It tends to get better with rest. He has had to take nitroglycerin 3 times in the last month. He also has some difficulty swallowing. Occasionally this gives him pressure in his chest. He says food sometimes gets stuck when swallowing however he seems to be able to swallow better with hot liquids. He has a remote history of EGD in the past but is never had a documented stricture or dilatation of the esophagus.  12/28/2016  Earl Keller returns today for  follow-up. At his last office visit I made an increase in his nitroglycerin however this is not significantly improved his angina. He still gets chest pain with exertion and particular with exercise. He says over the past several months his continue to limit his ability to exert himself. Based on this his findings are consistent with unstable angina. He has been intolerant to Ranexa in the past. Options seem to be somewhat limited for medical therapy at this point and I am recommending a cardiac catheterization.  04/20/2017  Earl Keller was seen today in follow-up. He is doing much better after his recent stenting in March. He was seen in follow-up by Azalee Course, PA-C. He was doing well at that time. Since then his continue to do well. He exercises  without limitations. He is able to do yard work without any problems. Unfortunately he's had aggressive recurrent to coronary disease. His LDL-C has been <70 on atorvastatin 40 mg daily. I'm wondering if we are missing something on his lipid profile - for example, elevated LP(a) which has shown to predict early graft failure if elevated. He is also enquiring today about reducing the dose of the isosorbide - I think this is reasonable.  PMHx:  Past Medical History:  Diagnosis Date  . CAD (coronary artery disease)    a. 2003: s/p CABG x5V  b. 06/2011: NSTEMI, 4/5 patent grafts (SVG to Diag occluded)- no good PCI targets.  c. 11/2014: [place]: s/p DES to SVG--> intermed    d. 12/2016: [place] s/p DES to SVG--> PDA and PCTA to SVG--> intermed  . Complication of anesthesia    "came to before they took the ETT out after OHS"  . Dyslipidemia   . GERD (gastroesophageal reflux disease)   . History of blood transfusion 06/2010   related to "stomach bleeding"  . History of gout 11/2014  . Hypertension   . NSTEMI (non-ST elevated myocardial infarction) (HCC) 06/2011   Hattie Perch 07/23/2011  . PAF (paroxysmal atrial fibrillation) (HCC)   . RA (rheumatoid arthritis) (HCC)    "a little  in my hands" (01/06/2017)  . Trouble swallowing    "comes and goes" (01/06/2017)  . Upper GI bleed 07/2010   "went in w/a scope and clipped a bleeding vessel"    Past Surgical History:  Procedure Laterality Date  . CARDIAC CATHETERIZATION  2012   showed patent grafts with small- vessel disease and distal disease of the native vessels. 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 CARDIAC CATHETERIZATION  11/30/2014   Procedure: LEFT HEART CATH AND CORS/GRAFTS ANGIOGRAPHY;  Surgeon: Kathleene Hazel, MD;  Location: [facility] CATH LAB;  Service: Cardiovascular;;  . CARDIAC CATHETERIZATION  11/30/2014   Procedure: CORONARY STENT INTERVENTION;  Surgeon: Kathleene Hazel, MD;  Location: [facility] CATH LAB;  Service: Cardiovascular;;  SVG to RI  . CORONARY ANGIOPLASTY WITH STENT PLACEMENT  11/30/2014   PTCA/DES OSLIUM  OF SVG  . CORONARY ANGIOPLASTY WITH STENT PLACEMENT  01/06/2017  . CORONARY ARTERY BYPASS GRAFT  01/2002   EF 45%; "before bypass"  . CORONARY STENT INTERVENTION N/A 01/06/2017   Procedure: Coronary Stent Intervention;  Surgeon: Kathleene Hazel, MD;  Location: [facility] INVASIVE CV LAB;  Service: Cardiovascular;  Laterality: N/A;  . LEFT HEART CATH AND CORS/GRAFTS ANGIOGRAPHY N/A 01/06/2017   Procedure: Left Heart Cath and Cors/Grafts Angiography;  Surgeon: Kathleene Hazel, MD;  Location: [facility] INVASIVE CV LAB;  Service: Cardiovascular;  Laterality: N/A;  . TONSILLECTOMY      FAMHx:  Family History  Problem Relation Age of Onset  . Sudden death Mother 10  . Liver disease Maternal Grandmother   . Anuerysm Maternal Grandfather   . Stroke Paternal Grandmother   . Heart attack Paternal Grandfather     SOCHx:   reports that he has never smoked. He has never used smokeless tobacco. He  reports that he does not drink alcohol or use drugs.  ALLERGIES:  Allergies  Allergen Reactions  . Ranexa [Ranolazine] Other (See Comments)    Vivid Dreams    ROS:  Pertinent items noted in HPI and remainder of comprehensive ROS otherwise negative.  HOME MEDS: Current Outpatient Prescriptions  Medication Sig Dispense Refill  . aspirin EC 81 MG tablet Take 81 mg by mouth daily.    Marland Kitchen atorvastatin (LIPITOR) 40 MG tablet TAKE ONE TABLET BY MOUTH ONCE DAILY (Patient taking differently: Take 40mg s by mouth daily at night) 90 tablet 3  . clopidogrel (PLAVIX) 75 MG tablet TAKE ONE TABLET BY MOUTH ONCE DAILY WITH BREAKFAST 90 tablet 3  . isosorbide mononitrate (IMDUR) 60 MG 24 hr tablet Take 1 tablet (60mg ) by mouth in the morning 135 tablet 1  . losartan (COZAAR) 25 MG tablet TAKE ONE TABLET BY MOUTH ONCE DAILY 90 tablet 3  . metoprolol tartrate (LOPRESSOR) 25 MG tablet TAKE ONE-HALF TABLET BY MOUTH TWICE DAILY (Patient taking differently: Take 12.5mg  by mouth twice daily) 90 tablet 3  . nitroGLYCERIN (NITROSTAT) 0.4 MG SL tablet Place 1 tablet (0.4 mg total) under the tongue every 5 (five) minutes as needed for chest pain. 25 tablet 3   No current facility-administered medications for this visit.     LABS/IMAGING: No results found for this or any previous visit (from the past 48 hour(s)). No results found.  VITALS: BP (!) 146/76   Pulse (!) 52   Ht 6\' 2"  (1.88 m)   Wt 192 lb 3.2 oz (87.2 kg)   BMI 24.68 kg/m   EXAM: General appearance: alert and no distress Neck: no carotid bruit, no JVD and thyroid not enlarged, symmetric, no tenderness/mass/nodules Lungs: clear to auscultation bilaterally Heart: regular rate and rhythm Abdomen: soft, non-tender; bowel sounds normal; no masses,  no organomegaly Extremities: extremities normal, atraumatic, no cyanosis or edema Pulses: 2+ and symmetric Skin: Skin color, texture, turgor normal. No rashes or lesions Neurologic: Grossly  normal Psych: Pleasant  EKG: Sinus bradycardia with first-degree AV block at 52, bifascicular block - personally reviewed  ASSESSMENT:  1. Unstable angina - s/p PCI to the SVG to RI and SVG to PDA with DES (12/2016) 2. 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 Status post PCI to the SVG to intermediate branch with a Promus Premier DES (11/2014) 5. CAD s/p CABG x 5 in 2003 6. Dyslipidemia 7. Ischemic cardioymyopathy, EF 40-45% (improved to 60-65% in 04/2016) 8. HTN  PLAN: 1.   Earl Keller has had significant improvement after recent coronary intervention in March 2018. It will require a lifelong dual antiplatelet therapy with aspirin and Plavix. He has a history of ischemic cardiopathy with EF of 40-45% which improved to 60-65% in 2017. He's asymptomatic at this point. He is LDL-C was less than 70 in Earl Keller is due for repeat check. Given his early graft occlusion may benefit from extended lipid profile as I'm concerned he may have an elevated lipoprotein a. If this is the case, will he would be a good candidate for PCSK9 inhibitor in addition to his statin. Finally, as he requested we'll reduce the dose of his isosorbide 30 mg daily. Should he have any further anginal symptoms he may need to go back up on that dose.  Follow-up with me in 6 months.  April 2018, MD, [facility] Attending Cardiologist [facility]  [id number] 04/20/2017, 10:11 AM

## 2017-04-20 NOTE — Patient Instructions (Signed)
Your physician recommends that you return for lab work FASTING to check cholesterol   Your physician has recommended you make the following change in your medication:  -- DECREASE isosorbide mononitrate (imdur) to 30mg  daily  Your physician wants you to follow-up in: 6 months with Dr. . You will receive a reminder letter in the mail two months in advance. If you don't receive a letter, please call our office to schedule the follow-up appointment.

## 2017-04-21 ENCOUNTER — Encounter: Payer: Self-pay | Admitting: Internal Medicine

## 2017-04-21 LAB — LIPOPROTEIN ANALYSIS BY NMR
HDL PARTICLE NUMBER: 30.9 umol/L (ref >=30.5)
LDL PARTICLE NUMBER: 839 nmol/L (ref <1000)
LDL SIZE: 20.8 nm (ref >20.5)
LP-IR SCORE: 62 — AB (ref <=45)
Small LDL Particle Number: 319 nmol/L (ref <=527)

## 2017-09-15 ENCOUNTER — Other Ambulatory Visit: Payer: Self-pay | Admitting: Internal Medicine

## 2017-09-15 NOTE — Telephone Encounter (Signed)
REFILL 

## 2017-10-14 ENCOUNTER — Encounter: Payer: Self-pay | Admitting: Internal Medicine

## 2017-10-14 ENCOUNTER — Ambulatory Visit: Payer: Medicare Other | Admitting: Internal Medicine

## 2017-10-14 VITALS — BP 128/84 | HR 55 | Ht 74.0 in | Wt 201.8 lb

## 2017-10-14 DIAGNOSIS — E785 Hyperlipidemia, unspecified: Secondary | ICD-10-CM

## 2017-10-14 DIAGNOSIS — Z951 Presence of aortocoronary bypass graft: Secondary | ICD-10-CM

## 2017-10-14 DIAGNOSIS — I2510 Atherosclerotic heart disease of native coronary artery without angina pectoris: Secondary | ICD-10-CM | POA: Diagnosis not present

## 2017-10-14 DIAGNOSIS — I10 Essential (primary) hypertension: Secondary | ICD-10-CM | POA: Diagnosis not present

## 2017-10-14 NOTE — Progress Notes (Signed)
OFFICE NOTE  Chief Complaint:  Routine follow-up  Primary Care Physician: Street, Stephanie Coup, MD  HPI:  Earl Keller is a 76 y.o. male with a history of coronary artery bypass grafting in 2003. In 2012 and non-ST elevation myocardial infarction and catheterization at that time showed patent grafts with small vessel disease and distal disease of native vessels. The ejection fraction that time was 45%. The patient has been doing well on medical therapy with no complaints of angina. He states he is quite active does some hiking and biking fairly regularly without any concerns. He currently denies nausea, vomiting, fever, chest pain, shortness of breath, orthopnea, dizziness, PND, cough, congestion, abdominal pain, hematochezia, melena, lower extremity edema, claudication.  He was previously on Ranexa and isosorbide.   I saw Earl Keller back in the office today. He reports that since January he's been developing a dull aching chest pain. The symptoms came on when walking after about a mile and improved a little bit. Recently he's been having symptoms that are lasting more during a walk and eventually has gotten to a point now where he cannot go on longer walks. He called the office and was restarted on his indoor and continues to have chest pain. He's had 2 episodes of chest discomfort in the past 2 days were taken nitroglycerin which provided some relief. We did note that his nitroglycerin as it is somewhat expired however does burn minimally under his tongue. He reported that initially had improvement in his pain however the symptoms came back after about an hour or so. These are occurring now at rest suggesting that this is more significant unstable angina. EKG in the office shows a stable right bundle branch block with no new ischemic changes.  Earl Keller returns today from his recent hospitalization. He was referred in for unstable angina and underwent heart catheterization. This demonstrated the  following:  Procedure Performed:  1. Left Heart Catheterization 2. Selective Coronary Angiography 3. SVG angiography 4. LIMA graft angiography 5. Left ventricular angiogram 6. PTCA/DES x 1 proximal body of SVG to intermediate branch 7. Angioseal right femoral artery  Operator: [name], MD  Indication: 76 yo male with history of 5V CABG in 2003 with most recent cath September 2012 with 4/5 patent grafts (SVG to Diagonal occluded) but noted to have severe disease in native targets with no options for PCI at that time. Recently having chest pain c/w unstable angina.   Procedure Details: The risks, benefits, complications, treatment options, and expected outcomes were discussed with the patient. The patient and/or family concurred with the proposed plan, giving informed consent. The patient was brought to the cath lab after IV hydration was begun and oral premedication was given. The patient was further sedated with Versed and Fentanyl. The right groin was prepped and draped in the usual manner. Using the modified Seldinger access technique, a 5 French sheath was placed in the right femoral artery. Standard diagnostic catheters were used to perform selective coronary angiography. The JR4 catheter was used to engage the RCA, the LIMA graft and all vein grafts. A pigtail catheter was used to perform a left ventricular angiogram. He was found to have severe stenosis in the ostium of the SVG to the intermediate branch. I elected to proceed to PCI of this vein graft.   PCI Note: He was given 600 mg Plavix po x 1. Angiomax weight based bolus given and drip started. When the ACT was over 200, I engaged the  SVG to the intermediate branch with a 6 French LCB guiding catheter. I then passed a Cougar IC wire down the SVG into the target intermediate branch. I did not use a distal protection device given the short distance of the graft to the target. I also needed to  make guide manipulations with the ostial location of the stenosis and did not want to have frequent drift of the distal protection device. A 2.5 x 12 mm balloon was used to pre-dilate the ostial stenosis x 1. I then deployed a 3.0 x 16 mm Promus Premier DES in the ostium of the SVG. The stent was post-dilated x 1 with a 3.25 x 12 mm [place] balloon x 1. The stenosis was taken from 99% down to 10%. He was given 24 mcg IC adenosine. There was excellent flow into the target vessel. Angioseal placed right femoral artery.   There were no immediate complications. The patient was taken to the recovery area in stable condition.   Hemodynamic Findings: Central aortic pressure: 118/58 Left ventricular pressure: 119/6/20  Angiographic Findings:  Left main: 10% ostial stenosis.   Left Anterior Descending Artery: Moderate caliber vessel that courses to the apex. The proximal vessel has diffuse 60-70% stenosis supplying a small diagonal branch. The mid and distal vessel fills antegrade and from the patent IMA graft.   Circumflex Artery: 100% ostial occlusion. The intermediate branch fills from the patent vein graft. The obtuse marginal branch fills from right to left collaterals (through filling of the SVG to the PDA).   Right Coronary Artery: 100% mid occlusion. The PDA and PLA fill from the patent vein graft. There is severe stenosis in the native PDA retrograde from the graft insertion but this is unchanged from last cath in 2012. This leads to the PLA.   Graft Anatomy:  SVG to Diagonal is occluded SVG sequential to intermediate branch and obtuse marginal is patent to the intermediate but occluded distal limb to the obtuse marginal.  SVG to PDA is patent with 50% anastomosis lesion into the PDA (unchanged from last cath) and as noted above, there is severe disease in the PDA in the more proximal portion of the vessel than where the graft inserts. This supplies the PLA retrograde and is unchanged from last  cath. (Unfavorable target for PCI).  LIMA graft to mid LAD is patent. 40% ostial stenosis IMA, unchanged from last cath.   Left Ventricular Angiogram: LVEF=40% with anterior wall hypokinesis. Mild MR.   Impression: 1. 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  2. 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 Mild to moderate LV systolic dysfunction 4. Successful PTCA/DES x 1 ostium of SVG to intermediate branch.   Recommendations: He will need dual anti-platelet therapy with ASA and Plavix for at least one year.    Complications: None. The patient tolerated the procedure well.  Since his percutaneous intervention to the vein graft to the intermediate branch she is doing much better. He denies any further angina. Unfortunately, his main complaint today is that he has pain in the first MTP joint of the right foot with redness and swelling as well as warmth. This has been going on for couple days and is difficult for him to ambulate. He says he's had some episode like this about a year ago in the left foot. He denies any prior diagnosis of gout. He does carry a history of rheumatoid arthritis, the details of which are not totally clear to  me. I'm not sure if he is followed up by a rheumatologist.  Mr. Mishkin returns today for follow-up. He reports that his gout has resolved. His uric acid level was elevated at 8.0 however he was not back to see his primary care provider for treatment. Unfortunately, an EKG in the office today shows either atypical atrial flutter or coarse atrial fibrillation. This is a new diagnosis for him. I showed him the EKGs and explained atrial fibrillation in great detail including the increased risk for stroke. He seemed to have a difficult time understanding the need to be on anticoagulation. We did discuss the elevated bleeding risk that he may face since he's currently on aspirin and Plavix for a stent that was placed in February.  He seems to be asymptomatic although recently has had some more fatigue which could be attributable to this. He denies any chest pain.   I saw Mr. Spainhour back today in the office. Fortunately he has converted back to sinus rhythm. 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 Unfortunately he did not start the blood thinner. He told me he has a strong family history of bleeding and his father almost bled to death on warfarin. He is also had some GI bleeding and is very concerned about the medication. He understands that his CHADSVASC score is 3, therefore increasing his wrist to about 5% annually for stroke. Despite these warnings he is adamant against taking a blood thinner. He understands the stroke could be physically disabling and even lead to death.  04-11-2016  Mr. Barnhard returns today for follow-up. He is without complaints at this time. He continues to decline taking anticoagulation because of concerns. He understands that he is at increased stroke risk with his A. fib. He fortunately has not had recurrent A. fib that was not aware of it immediately. EKG shows stable sinus bradycardia with first-degree AV block. On his heart catheterization 1 year ago it was noted that EF was low at 40-45%. This has not been reassessed since that time.  11/30/2016   Mr. Madrigal returns today for follow-up. He reports she's recently been having some symptoms he's concerned with being angina. He says with exercising on the treadmill he gets some chest discomfort. It tends to get better with rest. He has had to take nitroglycerin 3 times in the last month. He also has some difficulty swallowing. Occasionally this gives him pressure in his chest. He says food sometimes gets stuck when swallowing however he seems to be able to swallow better with hot liquids. He has a remote history of EGD in the past but is never had a documented stricture or dilatation of the esophagus.  12/28/2016  Mr. Bolduc returns today for follow-up. At his last  office visit I made an increase in his nitroglycerin however this is not significantly improved his angina. He still gets chest pain with exertion and particular with exercise. He says over the past several months his continue to limit his ability to exert himself. Based on this his findings are consistent with unstable angina. He has been intolerant to Ranexa in the past. Options seem to be somewhat limited for medical therapy at this point and I am recommending a cardiac catheterization.  04/20/2017  Mr. Santana was seen today in follow-up. He is doing much better after his recent stenting in March. He was seen in follow-up by Azalee Course, PA-C. He was doing well at that time. Since then his continue to do well. He exercises without limitations.  He is able to do yard work without any problems. Unfortunately he's had aggressive recurrent to coronary disease. His LDL-C has been <70 on atorvastatin 40 mg daily. I'm wondering if we are missing something on his lipid profile - for example, elevated LP(a) which has shown to predict early graft failure if elevated. He is also enquiring today about reducing the dose of the isosorbide - I think this is reasonable.  10/14/2017  Mr. Littleton returns today for follow-up.  It has been 6 months since I last saw him.  He continues to be chest pain-free.  He denies any worsening shortness of breath.  He had PCI in March 2018.  He is able to go back to most of his yard work and exercise.  He had a repeat lipid profile recently in June 2018 which showed an LDL-P of 839 and small LDL particle number of 319.  Overall this is a very favorable profile.  Pressure was initially elevated 158/78 however came down to 128/84.  PMHx:  Past Medical History:  Diagnosis Date  . CAD (coronary artery disease)    a. 2003: s/p CABG x5V  b. 06/2011: NSTEMI, 4/5 patent grafts (SVG to Diag occluded)- no good PCI targets.  c. 11/2014: [place]: s/p DES to SVG--> intermed    d. 12/2016: [place] s/p DES to SVG-->  PDA and PCTA to SVG--> intermed  . Complication of anesthesia    "came to before they took the ETT out after OHS"  . Dyslipidemia   . GERD (gastroesophageal reflux disease)   . History of blood transfusion 06/2010   related to "stomach bleeding"  . History of gout 11/2014  . Hypertension   . NSTEMI (non-ST elevated myocardial infarction) (HCC) 06/2011   Hattie Perch 07/23/2011  . PAF (paroxysmal atrial fibrillation) (HCC)   . RA (rheumatoid arthritis) (HCC)    "a little in my hands" (01/06/2017)  . Trouble swallowing    "comes and goes" (01/06/2017)  . Upper GI bleed 07/2010   "went in w/a scope and clipped a bleeding vessel"    Past Surgical History:  Procedure Laterality Date  . CARDIAC CATHETERIZATION  2012   showed patent grafts with small- vessel disease and distal disease of the native vessels. 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 CARDIAC CATHETERIZATION  11/30/2014   Procedure: LEFT HEART CATH AND CORS/GRAFTS ANGIOGRAPHY;  Surgeon: Kathleene Hazel, MD;  Location: [facility] CATH LAB;  Service: Cardiovascular;;  . CARDIAC CATHETERIZATION  11/30/2014   Procedure: CORONARY STENT INTERVENTION;  Surgeon: Kathleene Hazel, MD;  Location: [facility] CATH LAB;  Service: Cardiovascular;;  SVG to RI  . CORONARY ANGIOPLASTY WITH STENT PLACEMENT  11/30/2014   PTCA/DES OSLIUM  OF SVG  . CORONARY ANGIOPLASTY WITH STENT PLACEMENT  01/06/2017  . CORONARY ARTERY BYPASS GRAFT  01/2002   EF 45%; "before bypass"  . CORONARY STENT INTERVENTION N/A 01/06/2017   Procedure: Coronary Stent Intervention;  Surgeon: Kathleene Hazel, MD;  Location: [facility] INVASIVE CV LAB;  Service: Cardiovascular;  Laterality: N/A;  . LEFT HEART CATH AND CORS/GRAFTS ANGIOGRAPHY N/A 01/06/2017   Procedure: Left Heart Cath and  Cors/Grafts Angiography;  Surgeon: Kathleene Hazel, MD;  Location: [facility] INVASIVE CV LAB;  Service: Cardiovascular;  Laterality: N/A;  . TONSILLECTOMY  FAMHx:  Family History  Problem Relation Age of Onset  . Sudden death Mother 60  . Liver disease Maternal Grandmother   . Anuerysm Maternal Grandfather   . Stroke Paternal Grandmother   . Heart attack Paternal Grandfather     SOCHx:   reports that  has never smoked. he has never used smokeless tobacco. He reports that he does not drink alcohol or use drugs.  ALLERGIES:  Allergies  Allergen Reactions  . Ranexa [Ranolazine] Other (See Comments)    [place]    ROS: Pertinent items noted in HPI and remainder of comprehensive ROS otherwise negative.  HOME MEDS: Current Outpatient Medications  Medication Sig Dispense Refill  . aspirin EC 81 MG tablet Take 81 mg by mouth daily.    Marland Kitchen atorvastatin (LIPITOR) 40 MG tablet Take 1 tablet (40 mg total) by mouth daily. KEEP OV. 90 tablet 0  . clopidogrel (PLAVIX) 75 MG tablet Take 1 tablet (75 mg total) by mouth daily. KEEP OV. 90 tablet 0  . isosorbide mononitrate (IMDUR) 60 MG 24 hr tablet Take by mouth daily. Pt takes 30 mg daily    . losartan (COZAAR) 25 MG tablet TAKE ONE TABLET BY MOUTH ONCE DAILY 90 tablet 3  . metoprolol tartrate (LOPRESSOR) 25 MG tablet Take 0.5 tablets (12.5 mg total) by mouth 2 (two) times daily. KEEP OV. 90 tablet 0  . nitroGLYCERIN (NITROSTAT) 0.4 MG SL tablet Place 1 tablet (0.4 mg total) under the tongue every 5 (five) minutes as needed for chest pain. 25 tablet 3   No current facility-administered medications for this visit.     LABS/IMAGING: No results found for this or any previous visit (from the past 48 hour(s)). No results found.  VITALS: BP 128/84   Pulse (!) 55   Ht 6\' 2"  (1.88 m)   Wt 201 lb 12.8 oz (91.5 kg)   BMI 25.91 kg/m   EXAM: General appearance: alert and no distress Neck: no carotid bruit, no JVD and thyroid not  enlarged, symmetric, no tenderness/mass/nodules Lungs: clear to auscultation bilaterally Heart: regular rate and rhythm Abdomen: soft, non-tender; bowel sounds normal; no masses,  no organomegaly Extremities: extremities normal, atraumatic, no cyanosis or edema Pulses: 2+ and symmetric Skin: Skin color, texture, turgor normal. No rashes or lesions Neurologic: Grossly normal Psych: Pleasant  EKG: Sinus bradycardia first-degree AV block, bifascicular block at 57-personally reviewed  ASSESSMENT: 1. Unstable angina - s/p PCI to the SVG to RI and SVG to PDA with DES (12/2016) 2. 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 Status post PCI to the SVG to intermediate branch with a Promus Premier DES (11/2014) 5. CAD s/p CABG x 5 in 2003 6. Dyslipidemia 7. Ischemic cardioymyopathy, EF 40-45% (improved to 60-65% in 04/2016) 8. HTN  PLAN: 1.   Mr. Matusek is doing well denies any new chest pain or worsening shortness of breath.  He would likely need to stay on long-term dual antiplatelet therapy with aspirin and Plavix given his recurrent anginal symptoms.  He is done well on lower dose isosorbide but will maintain his current dose.  His lipid profile is at goal.  LVEF improved to normal in July 2017.  Follow-up with me in 6 months or sooner as necessary.  August 2017, MD, [facility], FACP  [place]  [facility]  Medical Director of the [facility]  [facility] Attending Cardiologist  Direct Dial:  [phone]  Fax: [phone]  Website:  [email or web address] 10/14/2017, 9:39 AM

## 2017-10-14 NOTE — Patient Instructions (Signed)
Your physician wants you to follow-up in: 6 months with Dr. Hilty. You will receive a reminder letter in the mail two months in advance. If you don't receive a letter, please call our office to schedule the follow-up appointment.    

## 2017-12-20 ENCOUNTER — Other Ambulatory Visit: Payer: Self-pay | Admitting: Internal Medicine

## 2017-12-20 NOTE — Telephone Encounter (Signed)
REFILL 

## 2018-05-09 IMAGING — CR DG CHEST 2V
2 series · 2 of 2 positions shown · non-contrast
Comparison: PA and lateral chest x-ray November 29, 2014

CLINICAL DATA: Preoperative examination prior to cardiac
catheterization next week. History of previous CABG, hyperlipidemia,
rheumatoid arthritis, nonsmoker.

EXAM:
CHEST  2 VIEW

[w chest pa]
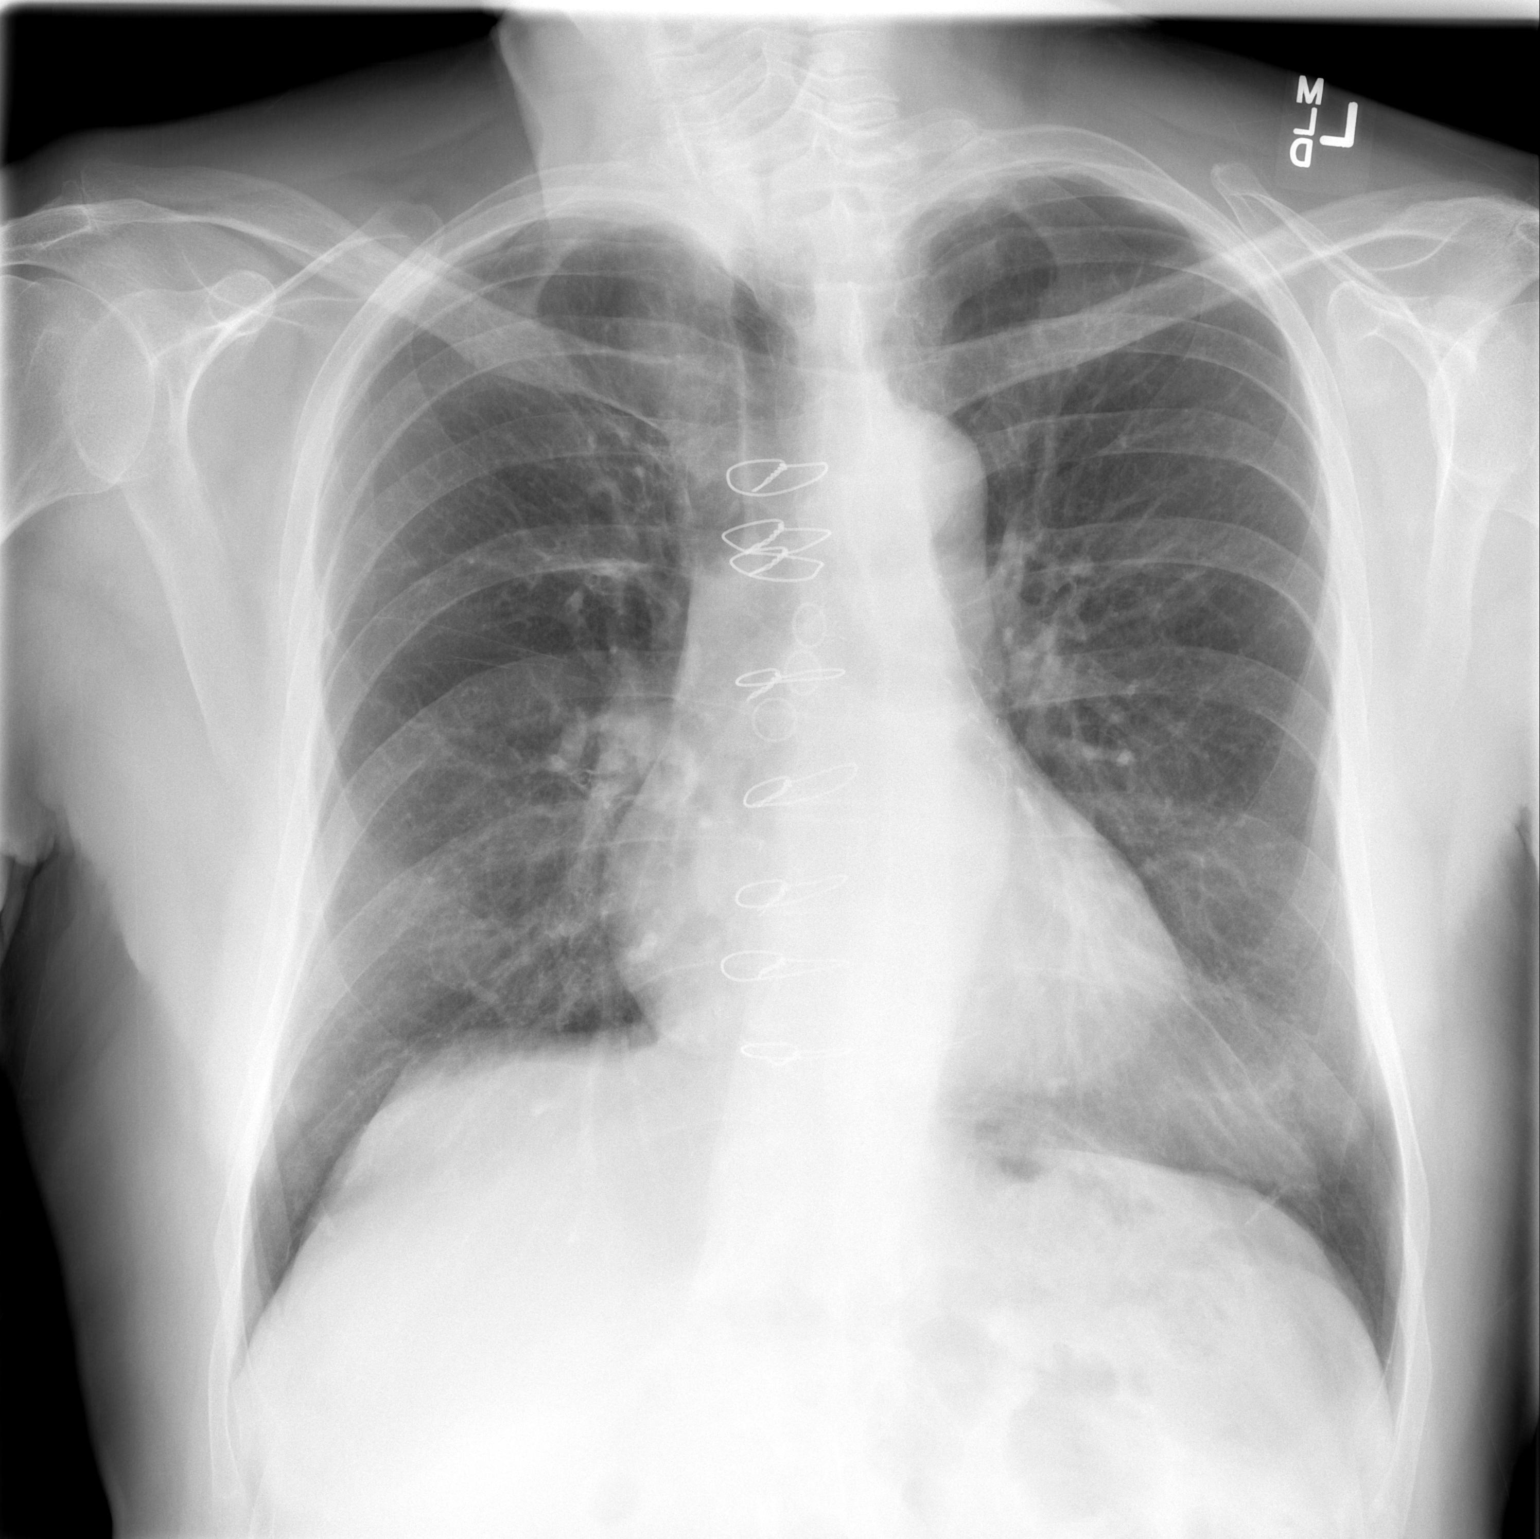

[w chest lat]
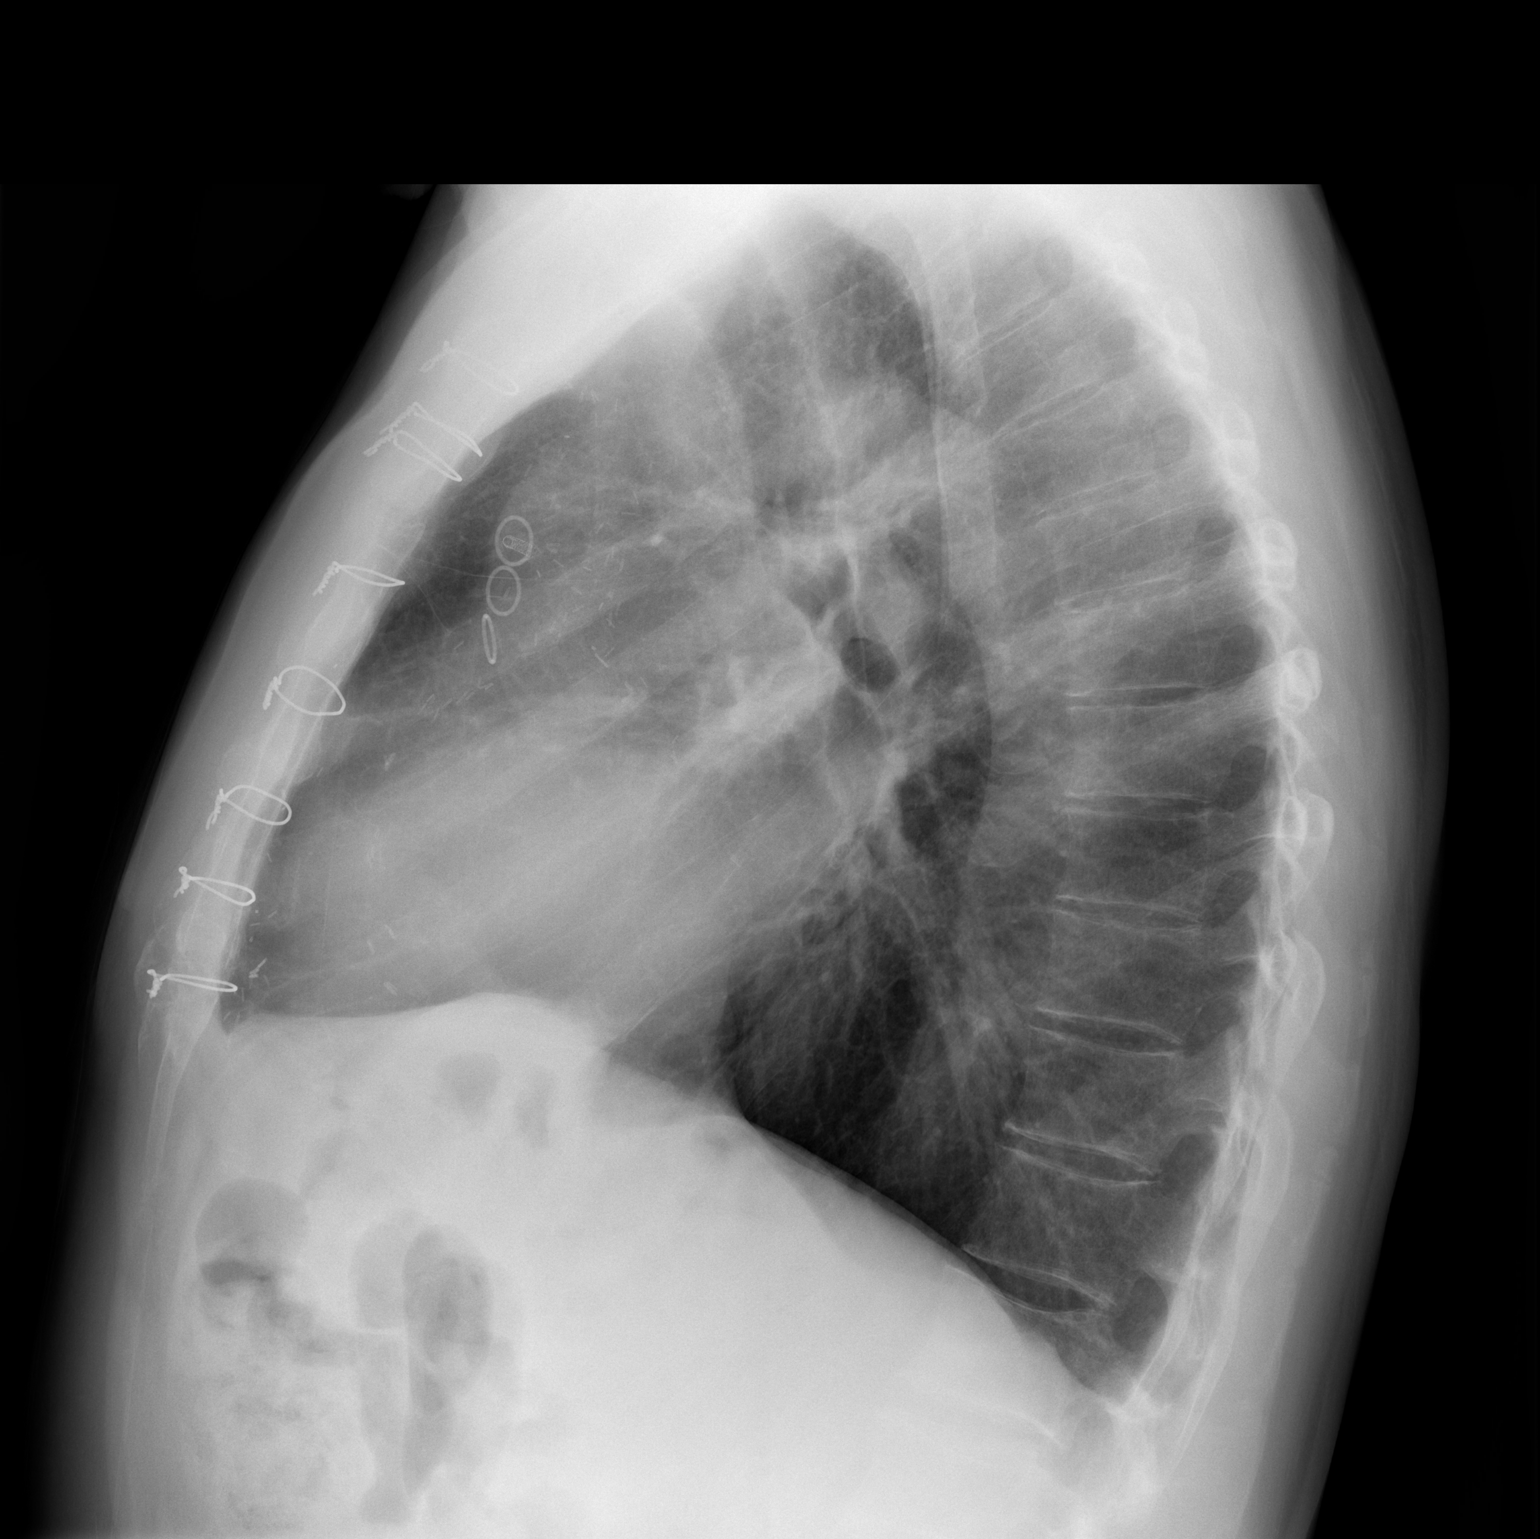

[2 of 2 positions shown; findings below may reference images not displayed]

FINDINGS: The lungs are adequately inflated. There is no focal infiltrate.
There is stable post CABG changes. The heart and pulmonary
vascularity are normal. The mediastinum is normal in width. The
retrosternal soft tissues are normal. There is no pleural effusion.
The bony thorax exhibits no acute abnormality.
IMPRESSION: There is no pneumonia nor other acute cardiopulmonary disease.
Stable post CABG changes.

## 2018-05-18 ENCOUNTER — Ambulatory Visit: Payer: Medicare Other | Admitting: Internal Medicine

## 2018-05-18 ENCOUNTER — Encounter: Payer: Self-pay | Admitting: Internal Medicine

## 2018-05-18 VITALS — BP 152/86 | HR 57 | Ht 72.0 in | Wt 206.0 lb

## 2018-05-18 DIAGNOSIS — Z951 Presence of aortocoronary bypass graft: Secondary | ICD-10-CM

## 2018-05-18 DIAGNOSIS — I10 Essential (primary) hypertension: Secondary | ICD-10-CM | POA: Diagnosis not present

## 2018-05-18 DIAGNOSIS — I25810 Atherosclerosis of coronary artery bypass graft(s) without angina pectoris: Secondary | ICD-10-CM

## 2018-05-18 DIAGNOSIS — E785 Hyperlipidemia, unspecified: Secondary | ICD-10-CM

## 2018-05-18 NOTE — Patient Instructions (Addendum)
Your physician recommends that you return for lab work FASTING to check cholesterol   Your physician wants you to follow-up in: 6 months with Dr. Hilty. You will receive a reminder letter in the mail two months in advance. If you don't receive a letter, please call our office to schedule the follow-up appointment.  

## 2018-05-18 NOTE — Progress Notes (Signed)
OFFICE NOTE  Chief Complaint:  Routine follow-up  Primary Care Physician: Street, Stephanie Coup, MD  HPI:  Earl Keller is a 77 y.o. male with a history of coronary artery bypass grafting in 2003. In 2012 and non-ST elevation myocardial infarction and catheterization at that time showed patent grafts with small vessel disease and distal disease of native vessels. The ejection fraction that time was 45%. The patient has been doing well on medical therapy with no complaints of angina. He states he is quite active does some hiking and biking fairly regularly without any concerns. He currently denies nausea, vomiting, fever, chest pain, shortness of breath, orthopnea, dizziness, PND, cough, congestion, abdominal pain, hematochezia, melena, lower extremity edema, claudication.  He was previously on Ranexa and isosorbide.   I saw Earl Keller back in the office today. He reports that since January he's been developing a dull aching chest pain. The symptoms came on when walking after about a mile and improved a little bit. Recently he's been having symptoms that are lasting more during a walk and eventually has gotten to a point now where he cannot go on longer walks. He called the office and was restarted on his indoor and continues to have chest pain. He's had 2 episodes of chest discomfort in the past 2 days were taken nitroglycerin which provided some relief. We did note that his nitroglycerin as it is somewhat expired however does burn minimally under his tongue. He reported that initially had improvement in his pain however the symptoms came back after about an hour or so. These are occurring now at rest suggesting that this is more significant unstable angina. EKG in the office shows a stable right bundle branch block with no new ischemic changes.  Earl Keller returns today from his recent hospitalization. He was referred in for unstable angina and underwent heart catheterization. This demonstrated the  following:  Procedure Performed:  1. Left Heart Catheterization 2. Selective Coronary Angiography 3. SVG angiography 4. LIMA graft angiography 5. Left ventricular angiogram 6. PTCA/DES x 1 proximal body of SVG to intermediate branch 7. Angioseal right femoral artery  Operator: [name], MD  Indication: 77 yo male with history of 5V CABG in 2003 with most recent cath September 2012 with 4/5 patent grafts (SVG to Diagonal occluded) but noted to have severe disease in native targets with no options for PCI at that time. Recently having chest pain c/w unstable angina.   Procedure Details: The risks, benefits, complications, treatment options, and expected outcomes were discussed with the patient. The patient and/or family concurred with the proposed plan, giving informed consent. The patient was brought to the cath lab after IV hydration was begun and oral premedication was given. The patient was further sedated with Versed and Fentanyl. The right groin was prepped and draped in the usual manner. Using the modified Seldinger access technique, a 5 French sheath was placed in the right femoral artery. Standard diagnostic catheters were used to perform selective coronary angiography. The JR4 catheter was used to engage the RCA, the LIMA graft and all vein grafts. A pigtail catheter was used to perform a left ventricular angiogram. He was found to have severe stenosis in the ostium of the SVG to the intermediate branch. I elected to proceed to PCI of this vein graft.   PCI Note: He was given 600 mg Plavix po x 1. Angiomax weight based bolus given and drip started. When the ACT was over 200, I engaged the  SVG to the intermediate branch with a 6 French LCB guiding catheter. I then passed a Cougar IC wire down the SVG into the target intermediate branch. I did not use a distal protection device given the short distance of the graft to the target. I also needed to  make guide manipulations with the ostial location of the stenosis and did not want to have frequent drift of the distal protection device. A 2.5 x 12 mm balloon was used to pre-dilate the ostial stenosis x 1. I then deployed a 3.0 x 16 mm Promus Premier DES in the ostium of the SVG. The stent was post-dilated x 1 with a 3.25 x 12 mm [place] balloon x 1. The stenosis was taken from 99% down to 10%. He was given 24 mcg IC adenosine. There was excellent flow into the target vessel. Angioseal placed right femoral artery.   There were no immediate complications. The patient was taken to the recovery area in stable condition.   Hemodynamic Findings: Central aortic pressure: 118/58 Left ventricular pressure: 119/6/20  Angiographic Findings:  Left main: 10% ostial stenosis.   Left Anterior Descending Artery: Moderate caliber vessel that courses to the apex. The proximal vessel has diffuse 60-70% stenosis supplying a small diagonal branch. The mid and distal vessel fills antegrade and from the patent IMA graft.   Circumflex Artery: 100% ostial occlusion. The intermediate branch fills from the patent vein graft. The obtuse marginal branch fills from right to left collaterals (through filling of the SVG to the PDA).   Right Coronary Artery: 100% mid occlusion. The PDA and PLA fill from the patent vein graft. There is severe stenosis in the native PDA retrograde from the graft insertion but this is unchanged from last cath in 2012. This leads to the PLA.   Graft Anatomy:  SVG to Diagonal is occluded SVG sequential to intermediate branch and obtuse marginal is patent to the intermediate but occluded distal limb to the obtuse marginal.  SVG to PDA is patent with 50% anastomosis lesion into the PDA (unchanged from last cath) and as noted above, there is severe disease in the PDA in the more proximal portion of the vessel than where the graft inserts. This supplies the PLA retrograde and is unchanged from last  cath. (Unfavorable target for PCI).  LIMA graft to mid LAD is patent. 40% ostial stenosis IMA, unchanged from last cath.   Left Ventricular Angiogram: LVEF=40% with anterior wall hypokinesis. Mild MR.   Impression: 1. 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  2. 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 Mild to moderate LV systolic dysfunction 4. Successful PTCA/DES x 1 ostium of SVG to intermediate branch.   Recommendations: He will need dual anti-platelet therapy with ASA and Plavix for at least one year.    Complications: None. The patient tolerated the procedure well.  Since his percutaneous intervention to the vein graft to the intermediate branch she is doing much better. He denies any further angina. Unfortunately, his main complaint today is that he has pain in the first MTP joint of the right foot with redness and swelling as well as warmth. This has been going on for couple days and is difficult for him to ambulate. He says he's had some episode like this about a year ago in the left foot. He denies any prior diagnosis of gout. He does carry a history of rheumatoid arthritis, the details of which are not totally clear to  me. I'm not sure if he is followed up by a rheumatologist.  Earl Keller returns today for follow-up. He reports that his gout has resolved. His uric acid level was elevated at 8.0 however he was not back to see his primary care provider for treatment. Unfortunately, an EKG in the office today shows either atypical atrial flutter or coarse atrial fibrillation. This is a new diagnosis for him. I showed him the EKGs and explained atrial fibrillation in great detail including the increased risk for stroke. He seemed to have a difficult time understanding the need to be on anticoagulation. We did discuss the elevated bleeding risk that he may face since he's currently on aspirin and Plavix for a stent that was placed in February.  He seems to be asymptomatic although recently has had some more fatigue which could be attributable to this. He denies any chest pain.   I saw Earl Keller back today in the office. Fortunately he has converted back to sinus rhythm. 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 Unfortunately he did not start the blood thinner. He told me he has a strong family history of bleeding and his father almost bled to death on warfarin. He is also had some GI bleeding and is very concerned about the medication. He understands that his CHADSVASC score is 3, therefore increasing his wrist to about 5% annually for stroke. Despite these warnings he is adamant against taking a blood thinner. He understands the stroke could be physically disabling and even lead to death.  04-11-2016  Earl Keller returns today for follow-up. He is without complaints at this time. He continues to decline taking anticoagulation because of concerns. He understands that he is at increased stroke risk with his A. fib. He fortunately has not had recurrent A. fib that was not aware of it immediately. EKG shows stable sinus bradycardia with first-degree AV block. On his heart catheterization 1 year ago it was noted that EF was low at 40-45%. This has not been reassessed since that time.  11/30/2016   Earl Keller returns today for follow-up. He reports she's recently been having some symptoms he's concerned with being angina. He says with exercising on the treadmill he gets some chest discomfort. It tends to get better with rest. He has had to take nitroglycerin 3 times in the last month. He also has some difficulty swallowing. Occasionally this gives him pressure in his chest. He says food sometimes gets stuck when swallowing however he seems to be able to swallow better with hot liquids. He has a remote history of EGD in the past but is never had a documented stricture or dilatation of the esophagus.  12/28/2016  Earl Keller returns today for follow-up. At his last  office visit I made an increase in his nitroglycerin however this is not significantly improved his angina. He still gets chest pain with exertion and particular with exercise. He says over the past several months his continue to limit his ability to exert himself. Based on this his findings are consistent with unstable angina. He has been intolerant to Ranexa in the past. Options seem to be somewhat limited for medical therapy at this point and I am recommending a cardiac catheterization.  04/20/2017  Earl Keller was seen today in follow-up. He is doing much better after his recent stenting in March. He was seen in follow-up by Azalee Course, PA-C. He was doing well at that time. Since then his continue to do well. He exercises without limitations.  He is able to do yard work without any problems. Unfortunately he's had aggressive recurrent to coronary disease. His LDL-C has been <70 on atorvastatin 40 mg daily. I'm wondering if we are missing something on his lipid profile - for example, elevated LP(a) which has shown to predict early graft failure if elevated. He is also enquiring today about reducing the dose of the isosorbide - I think this is reasonable.  10/14/2017  Earl Keller returns today for follow-up.  It has been 6 months since I last saw him.  He continues to be chest pain-free.  He denies any worsening shortness of breath.  He had PCI in March 2018.  He is able to go back to most of his yard work and exercise.  He had a repeat lipid profile recently in June 2018 which showed an LDL-P of 839 and small LDL particle number of 319.  Overall this is a very favorable profile.  Pressure was initially elevated 158/78 however came down to 128/84.  05/18/2018  Seen today in follow-up. No chest pain. Has gained about 5-6 lbs. Reports snacking more at night. Walks on a daily basis. Had a well-controlled lipid profile a year ago. Will need a recheck. BP elevated today - did come down again to 140 systolic .Marland Kitchen He says  it is in the 120's at home.  PMHx:  Past Medical History:  Diagnosis Date  . CAD (coronary artery disease)    a. 2003: s/p CABG x5V  b. 06/2011: NSTEMI, 4/5 patent grafts (SVG to Diag occluded)- no good PCI targets.  c. 11/2014: [place]: s/p DES to SVG--> intermed    d. 12/2016: [place] s/p DES to SVG--> PDA and PCTA to SVG--> intermed  . Complication of anesthesia    "came to before they took the ETT out after OHS"  . Dyslipidemia   . GERD (gastroesophageal reflux disease)   . History of blood transfusion 06/2010   related to "stomach bleeding"  . History of gout 11/2014  . Hypertension   . NSTEMI (non-ST elevated myocardial infarction) (HCC) 06/2011   Hattie Perch 07/23/2011  . PAF (paroxysmal atrial fibrillation) (HCC)   . RA (rheumatoid arthritis) (HCC)    "a little in my hands" (01/06/2017)  . Trouble swallowing    "comes and goes" (01/06/2017)  . Upper GI bleed 07/2010   "went in w/a scope and clipped a bleeding vessel"    Past Surgical History:  Procedure Laterality Date  . CARDIAC CATHETERIZATION  2012   showed patent grafts with small- vessel disease and distal disease of the native vessels. 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 CARDIAC CATHETERIZATION  11/30/2014   Procedure: LEFT HEART CATH AND CORS/GRAFTS ANGIOGRAPHY;  Surgeon: Kathleene Hazel, MD;  Location: [facility] CATH LAB;  Service: Cardiovascular;;  . CARDIAC CATHETERIZATION  11/30/2014   Procedure: CORONARY STENT INTERVENTION;  Surgeon: Kathleene Hazel, MD;  Location: [facility] CATH LAB;  Service: Cardiovascular;;  SVG to RI  . CORONARY ANGIOPLASTY WITH STENT PLACEMENT  11/30/2014   PTCA/DES OSLIUM  OF SVG  . CORONARY ANGIOPLASTY WITH STENT PLACEMENT  01/06/2017  . CORONARY ARTERY BYPASS GRAFT  01/2002   EF 45%; "before bypass"    . CORONARY STENT INTERVENTION N/A 01/06/2017   Procedure: Coronary Stent Intervention;  Surgeon: Kathleene Hazel, MD;  Location: [facility] INVASIVE CV LAB;  Service: Cardiovascular;  Laterality: N/A;  . LEFT HEART CATH AND CORS/GRAFTS ANGIOGRAPHY N/A 01/06/2017   Procedure: Left Heart Cath and Cors/Grafts Angiography;  Surgeon: Kathleene Hazel, MD;  Location: [facility] INVASIVE CV LAB;  Service: Cardiovascular;  Laterality: N/A;  . TONSILLECTOMY      FAMHx:  Family History  Problem Relation Age of Onset  . Sudden death Mother 22  . Liver disease Maternal Grandmother   . Anuerysm Maternal Grandfather   . Stroke Paternal Grandmother   . Heart attack Paternal Grandfather     SOCHx:   reports that he has never smoked. He has never used smokeless tobacco. He reports that he does not drink alcohol or use drugs.  ALLERGIES:  Allergies  Allergen Reactions  . Ranexa [Ranolazine] Other (See Comments)    [place]    ROS: Pertinent items noted in HPI and remainder of comprehensive ROS otherwise negative.  HOME MEDS: Current Outpatient Medications  Medication Sig Dispense Refill  . aspirin EC 81 MG tablet Take 81 mg by mouth daily.    Marland Kitchen atorvastatin (LIPITOR) 40 MG tablet TAKE 1 TABLET BY MOUTH ONCE DAILY 90 tablet 1  . clopidogrel (PLAVIX) 75 MG tablet TAKE 1 TABLET BY MOUTH ONCE DAILY 90 tablet 1  . isosorbide mononitrate (IMDUR) 60 MG 24 hr tablet TAKE 1 TABLET BY MOUTH IN THE MORNING AND 1/2 (ONE-HALF) IN THE EVENING 135 tablet 1  . losartan (COZAAR) 25 MG tablet TAKE 1 TABLET BY MOUTH ONCE DAILY 90 tablet 1  . metoprolol tartrate (LOPRESSOR) 25 MG tablet TAKE 1/2 (ONE-HALF) TABLET BY MOUTH TWICE DAILY 90 tablet 1  . nitroGLYCERIN (NITROSTAT) 0.4 MG SL tablet Place 1 tablet (0.4 mg total) under the tongue every 5 (five) minutes as needed for chest pain. 25 tablet 3   No current facility-administered medications for this visit.     LABS/IMAGING: No results found for this or any  previous visit (from the past 48 hour(s)). No results found.  VITALS: BP (!) 152/86   Pulse (!) 57   Ht 6' (1.829 m)   Wt 206 lb (93.4 kg)   SpO2 96%   BMI 27.94 kg/m   EXAM: General appearance: alert and no distress Neck: no carotid bruit, no JVD and thyroid not enlarged, symmetric, no tenderness/mass/nodules Lungs: clear to auscultation bilaterally Heart: regular rate and rhythm Abdomen: soft, non-tender; bowel sounds normal; no masses,  no organomegaly Extremities: extremities normal, atraumatic, no cyanosis or edema Pulses: 2+ and symmetric Skin: Skin color, texture, turgor normal. No rashes or lesions Neurologic: Grossly normal Psych: Pleasant  EKG: Sinus bradycardia first-degree AV block, bifascicular block at 57-personally reviewed  ASSESSMENT: 1. Unstable angina - s/p PCI to the SVG to RI and SVG to PDA with DES (12/2016) 2. 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 Status post PCI to the SVG to intermediate branch with a Promus Premier DES (11/2014) 5. CAD s/p CABG x 5 in 2003 6. Dyslipidemia 7. Ischemic cardioymyopathy, EF 40-45% (improved to 60-65% in 04/2016) 8. HTN  PLAN: 1.   Earl Keller continues to do well without angina. Will continue DAPT indefinitely, but can stop plavix at any point for procedures. Will recheck lipid profile today. Continue exercise and dietary modifications for goal weight loss of 5-6 lbs.  Follow-up with me in 6 months or sooner as necessary.  Chrystie Nose,  MD, Milagros Loll  [place]  [facility]  Medical Director of the [facility]  Card[facility]ovascular [facility] Clinic Attending Cardiologist  Direct Dial: [phone]  Fax: [phone]  Website:  [email or web address] Nicely Alzena Gerber 05/18/2018, 10:25 AM

## 2018-05-19 LAB — LIPID PANEL
CHOL/HDL RATIO: 4.1 ratio (ref 0.0–5.0)
Cholesterol, Total: 141 mg/dL (ref 100–199)
HDL: 34 mg/dL — AB (ref >39)
LDL Calculated: 64 mg/dL (ref 0–99)
TRIGLYCERIDES: 216 mg/dL — AB (ref 0–149)
VLDL Cholesterol Cal: 43 mg/dL — ABNORMAL HIGH (ref 5–40)

## 2018-06-24 ENCOUNTER — Other Ambulatory Visit: Payer: Self-pay | Admitting: Internal Medicine

## 2018-12-27 ENCOUNTER — Ambulatory Visit: Payer: Medicare Other | Admitting: Internal Medicine

## 2018-12-27 ENCOUNTER — Encounter: Payer: Self-pay | Admitting: Internal Medicine

## 2018-12-27 VITALS — BP 154/88 | HR 58 | Ht 74.0 in | Wt 214.6 lb

## 2018-12-27 DIAGNOSIS — E785 Hyperlipidemia, unspecified: Secondary | ICD-10-CM | POA: Diagnosis not present

## 2018-12-27 DIAGNOSIS — I10 Essential (primary) hypertension: Secondary | ICD-10-CM | POA: Diagnosis not present

## 2018-12-27 DIAGNOSIS — I480 Paroxysmal atrial fibrillation: Secondary | ICD-10-CM

## 2018-12-27 DIAGNOSIS — Z951 Presence of aortocoronary bypass graft: Secondary | ICD-10-CM

## 2018-12-27 LAB — LIPID PANEL
CHOL/HDL RATIO: 3.9 ratio (ref 0.0–5.0)
Cholesterol, Total: 139 mg/dL (ref 100–199)
HDL: 36 mg/dL — AB (ref >39)
LDL Calculated: 68 mg/dL (ref 0–99)
Triglycerides: 176 mg/dL — ABNORMAL HIGH (ref 0–149)
VLDL Cholesterol Cal: 35 mg/dL (ref 5–40)

## 2018-12-27 NOTE — Patient Instructions (Signed)
Medication Instructions:  Your Physician recommend you continue on your current medication as directed.    If you need a refill on your cardiac medications before your next appointment, please call your pharmacy.   Lab work: Your physician recommends that you return for lab work today (fasting lipid)  If you have labs (blood work) drawn today and your tests are completely normal, you will receive your results only by: Marland Kitchen MyChart Message (if you have MyChart) OR . A paper copy in the mail If you have any lab test that is abnormal or we need to change your treatment, we will call you to review the results.  Testing/Procedures: None  Follow-Up: At [facility], you and your health needs are our priority.  As part of our continuing mission to provide you with exceptional heart care, we have created designated Provider Care Teams.  These Care Teams include your primary Cardiologist (physician) and Advanced Practice Providers (APPs -  Physician Assistants and Nurse Practitioners) who all work together to provide you with the care you need, when you need it. You will need a follow up appointment in 6 months.  Please call our office 2 months in advance to schedule this appointment.  You may see Dr. Rennis Golden or one of the following Advanced Practice Providers on your designated Care Team: Azalee Course, [place] . Micah Flesher, PA-C

## 2018-12-27 NOTE — Progress Notes (Signed)
OFFICE NOTE  Chief Complaint:  Routine follow-up  Primary Care Physician: Street, Stephanie Coup, MD  HPI:  Earl Keller is a 78 y.o. male with a history of coronary artery bypass grafting in 2003. In 2012 and non-ST elevation myocardial infarction and catheterization at that time showed patent grafts with small vessel disease and distal disease of native vessels. The ejection fraction that time was 45%. The patient has been doing well on medical therapy with no complaints of angina. He states he is quite active does some hiking and biking fairly regularly without any concerns. He currently denies nausea, vomiting, fever, chest pain, shortness of breath, orthopnea, dizziness, PND, cough, congestion, abdominal pain, hematochezia, melena, lower extremity edema, claudication.  He was previously on Ranexa and isosorbide.   I saw Earl Keller back in the office today. He reports that since January he's been developing a dull aching chest pain. The symptoms came on when walking after about a mile and improved a little bit. Recently he's been having symptoms that are lasting more during a walk and eventually has gotten to a point now where he cannot go on longer walks. He called the office and was restarted on his indoor and continues to have chest pain. He's had 2 episodes of chest discomfort in the past 2 days were taken nitroglycerin which provided some relief. We did note that his nitroglycerin as it is somewhat expired however does burn minimally under his tongue. He reported that initially had improvement in his pain however the symptoms came back after about an hour or so. These are occurring now at rest suggesting that this is more significant unstable angina. EKG in the office shows a stable right bundle branch block with no new ischemic changes.  Earl Keller returns today from his recent hospitalization. He was referred in for unstable angina and underwent heart catheterization. This demonstrated the  following:  Procedure Performed:  1. Left Heart Catheterization 2. Selective Coronary Angiography 3. SVG angiography 4. LIMA graft angiography 5. Left ventricular angiogram 6. PTCA/DES x 1 proximal body of SVG to intermediate branch 7. Angioseal right femoral artery  Operator: [name], MD  Indication: 78 yo male with history of 5V CABG in 2003 with most recent cath September 2012 with 4/5 patent grafts (SVG to Diagonal occluded) but noted to have severe disease in native targets with no options for PCI at that time. Recently having chest pain c/w unstable angina.   Procedure Details: The risks, benefits, complications, treatment options, and expected outcomes were discussed with the patient. The patient and/or family concurred with the proposed plan, giving informed consent. The patient was brought to the cath lab after IV hydration was begun and oral premedication was given. The patient was further sedated with Versed and Fentanyl. The right groin was prepped and draped in the usual manner. Using the modified Seldinger access technique, a 5 French sheath was placed in the right femoral artery. Standard diagnostic catheters were used to perform selective coronary angiography. The JR4 catheter was used to engage the RCA, the LIMA graft and all vein grafts. A pigtail catheter was used to perform a left ventricular angiogram. He was found to have severe stenosis in the ostium of the SVG to the intermediate branch. I elected to proceed to PCI of this vein graft.   PCI Note: He was given 600 mg Plavix po x 1. Angiomax weight based bolus given and drip started. When the ACT was over 200, I engaged the  SVG to the intermediate branch with a 6 French LCB guiding catheter. I then passed a Cougar IC wire down the SVG into the target intermediate branch. I did not use a distal protection device given the short distance of the graft to the target. I also needed to  make guide manipulations with the ostial location of the stenosis and did not want to have frequent drift of the distal protection device. A 2.5 x 12 mm balloon was used to pre-dilate the ostial stenosis x 1. I then deployed a 3.0 x 16 mm Promus Premier DES in the ostium of the SVG. The stent was post-dilated x 1 with a 3.25 x 12 mm [place] balloon x 1. The stenosis was taken from 99% down to 10%. He was given 24 mcg IC adenosine. There was excellent flow into the target vessel. Angioseal placed right femoral artery.   There were no immediate complications. The patient was taken to the recovery area in stable condition.   Hemodynamic Findings: Central aortic pressure: 118/58 Left ventricular pressure: 119/6/20  Angiographic Findings:  Left main: 10% ostial stenosis.   Left Anterior Descending Artery: Moderate caliber vessel that courses to the apex. The proximal vessel has diffuse 60-70% stenosis supplying a small diagonal branch. The mid and distal vessel fills antegrade and from the patent IMA graft.   Circumflex Artery: 100% ostial occlusion. The intermediate branch fills from the patent vein graft. The obtuse marginal branch fills from right to left collaterals (through filling of the SVG to the PDA).   Right Coronary Artery: 100% mid occlusion. The PDA and PLA fill from the patent vein graft. There is severe stenosis in the native PDA retrograde from the graft insertion but this is unchanged from last cath in 2012. This leads to the PLA.   Graft Anatomy:  SVG to Diagonal is occluded SVG sequential to intermediate branch and obtuse marginal is patent to the intermediate but occluded distal limb to the obtuse marginal.  SVG to PDA is patent with 50% anastomosis lesion into the PDA (unchanged from last cath) and as noted above, there is severe disease in the PDA in the more proximal portion of the vessel than where the graft inserts. This supplies the PLA retrograde and is unchanged from last  cath. (Unfavorable target for PCI).  LIMA graft to mid LAD is patent. 40% ostial stenosis IMA, unchanged from last cath.   Left Ventricular Angiogram: LVEF=40% with anterior wall hypokinesis. Mild MR.   Impression: 1. 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  2. 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 Mild to moderate LV systolic dysfunction 4. Successful PTCA/DES x 1 ostium of SVG to intermediate branch.   Recommendations: He will need dual anti-platelet therapy with ASA and Plavix for at least one year.    Complications: None. The patient tolerated the procedure well.  Since his percutaneous intervention to the vein graft to the intermediate branch she is doing much better. He denies any further angina. Unfortunately, his main complaint today is that he has pain in the first MTP joint of the right foot with redness and swelling as well as warmth. This has been going on for couple days and is difficult for him to ambulate. He says he's had some episode like this about a year ago in the left foot. He denies any prior diagnosis of gout. He does carry a history of rheumatoid arthritis, the details of which are not totally clear to  me. I'm not sure if he is followed up by a rheumatologist.  Earl Keller returns today for follow-up. He reports that his gout has resolved. His uric acid level was elevated at 8.0 however he was not back to see his primary care provider for treatment. Unfortunately, an EKG in the office today shows either atypical atrial flutter or coarse atrial fibrillation. This is a new diagnosis for him. I showed him the EKGs and explained atrial fibrillation in great detail including the increased risk for stroke. He seemed to have a difficult time understanding the need to be on anticoagulation. We did discuss the elevated bleeding risk that he may face since he's currently on aspirin and Plavix for a stent that was placed in February.  He seems to be asymptomatic although recently has had some more fatigue which could be attributable to this. He denies any chest pain.   I saw Earl Keller back today in the office. Fortunately he has converted back to sinus rhythm. 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 Unfortunately he did not start the blood thinner. He told me he has a strong family history of bleeding and his father almost bled to death on warfarin. He is also had some GI bleeding and is very concerned about the medication. He understands that his CHADSVASC score is 3, therefore increasing his wrist to about 5% annually for stroke. Despite these warnings he is adamant against taking a blood thinner. He understands the stroke could be physically disabling and even lead to death.  April 18, 2016  Earl Keller returns today for follow-up. He is without complaints at this time. He continues to decline taking anticoagulation because of concerns. He understands that he is at increased stroke risk with his A. fib. He fortunately has not had recurrent A. fib that was not aware of it immediately. EKG shows stable sinus bradycardia with first-degree AV block. On his heart catheterization 1 year ago it was noted that EF was low at 40-45%. This has not been reassessed since that time.  11/30/2016   Earl Keller returns today for follow-up. He reports she's recently been having some symptoms he's concerned with being angina. He says with exercising on the treadmill he gets some chest discomfort. It tends to get better with rest. He has had to take nitroglycerin 3 times in the last month. He also has some difficulty swallowing. Occasionally this gives him pressure in his chest. He says food sometimes gets stuck when swallowing however he seems to be able to swallow better with hot liquids. He has a remote history of EGD in the past but is never had a documented stricture or dilatation of the esophagus.  12/28/2016  Earl Keller returns today for follow-up. At his last  office visit I made an increase in his nitroglycerin however this is not significantly improved his angina. He still gets chest pain with exertion and particular with exercise. He says over the past several months his continue to limit his ability to exert himself. Based on this his findings are consistent with unstable angina. He has been intolerant to Ranexa in the past. Options seem to be somewhat limited for medical therapy at this point and I am recommending a cardiac catheterization.  04/20/2017  Earl Keller was seen today in follow-up. He is doing much better after his recent stenting in March. He was seen in follow-up by Azalee Course, PA-C. He was doing well at that time. Since then his continue to do well. He exercises without limitations.  He is able to do yard work without any problems. Unfortunately he's had aggressive recurrent to coronary disease. His LDL-C has been <70 on atorvastatin 40 mg daily. I'm wondering if we are missing something on his lipid profile - for example, elevated LP(a) which has shown to predict early graft failure if elevated. He is also enquiring today about reducing the dose of the isosorbide - I think this is reasonable.  10/14/2017  Earl Keller returns today for follow-up.  It has been 6 months since I last saw him.  He continues to be chest pain-free.  He denies any worsening shortness of breath.  He had PCI in March 2018.  He is able to go back to most of his yard work and exercise.  He had a repeat lipid profile recently in June 2018 which showed an LDL-P of 839 and small LDL particle number of 319.  Overall this is a very favorable profile.  Pressure was initially elevated 158/78 however came down to 128/84.  05/18/2018  Seen today in follow-up. No chest pain. Has gained about 5-6 lbs. Reports snacking more at night. Walks on a daily basis. Had a well-controlled lipid profile a year ago. Will need a recheck. BP elevated today - did come down again to 140 systolic .Marland Kitchen He says  it is in the 120's at home.  12/27/2018  Earl Keller is seen today in follow-up.  Overall he is doing well.  He denies any chest pain or shortness of breath.  He says he works daily for about an hour and has no symptoms with that.  It was noted last summer that his triglycerides were elevated 216.  LDL is at goal 64 total cholesterol 141.  Since then there is been about 8 Keller weight gain.  Although he exercises, suspect diet could be improved somewhat.  I think this represents some persistent cardiovascular risk.  Blood pressure was elevated today however he said he did not take his medications this morning.  He is fasting.  PMHx:  Past Medical History:  Diagnosis Date  . CAD (coronary artery disease)    a. 2003: s/p CABG x5V  b. 06/2011: NSTEMI, 4/5 patent grafts (SVG to Diag occluded)- no good PCI targets.  c. 11/2014: [place]: s/p DES to SVG--> intermed    d. 12/2016: [place] s/p DES to SVG--> PDA and PCTA to SVG--> intermed  . Complication of anesthesia    "came to before they took the ETT out after OHS"  . Dyslipidemia   . GERD (gastroesophageal reflux disease)   . History of blood transfusion 06/2010   related to "stomach bleeding"  . History of gout 11/2014  . Hypertension   . NSTEMI (non-ST elevated myocardial infarction) (HCC) 06/2011   Hattie Perch 07/23/2011  . PAF (paroxysmal atrial fibrillation) (HCC)   . RA (rheumatoid arthritis) (HCC)    "a little in my hands" (01/06/2017)  . Trouble swallowing    "comes and goes" (01/06/2017)  . Upper GI bleed 07/2010   "went in w/a scope and clipped a bleeding vessel"    Past Surgical History:  Procedure Laterality Date  . CARDIAC CATHETERIZATION  2012   showed patent grafts with small- vessel disease and distal disease of the native vessels. There is also some osteal narrowing of his internal mammary artery and the vein graft to the OM and the distal anastomotic site of the vein to the RCA, there was nothing that needed to be intervened upon medically,  his EF was still  around 45%  . CARDIAC CATHETERIZATION  11/30/2014   Procedure: LEFT HEART CATH AND CORS/GRAFTS ANGIOGRAPHY;  Surgeon: Kathleene Hazel, MD;  Location[facility] C[facility] LAB;  Service: Cardiovascular;;  . CARDIAC CATHETERIZATION  11/30/2014   Procedure: CORONARY STENT INTERVENTION;  Surgeon: Kathleene Hazel, MD;  Location: [facility] CATH LAB;  Service: Cardiovascular;;  SVG to RI  . CORONARY ANGIOPLASTY WITH STENT PLACEMENT  11/30/2014   PTCA/DES OSLIUM  OF SVG  . CORONARY ANGIOPLASTY WITH STENT PLACEMENT  01/06/2017  . CORONARY ARTERY BYPASS GRAFT  01/2002   EF 45%; "before bypass"  . CORONARY STENT INTERVENTION N/A 01/06/2017   Procedure: Coronary Stent Intervention;  Surgeon: Kathleene Hazel, MD;  Location: [facility] INVASIVE CV LAB;  Service: Cardiovascular;  Laterality: N/A;  . LEFT HEART CATH AND CORS/GRAFTS ANGIOGRAPHY N/A 01/06/2017   Procedure: Left Heart Cath and Cors/Grafts Angiography;  Surgeon: Kathleene Hazel, MD;  Location: [facility] INVASIVE CV LAB;  Service: Cardiovascular;  Laterality: N/A;  . TONSILLECTOMY      FAMHx:  Family History  Problem Relation Age of Onset  . Sudden death Mother 17  . Liver disease Maternal Grandmother   . Anuerysm Maternal Grandfather   . Stroke Paternal Grandmother   . Heart attack Paternal Grandfather     SOCHx:   reports that he has never smoked. He has never used smokeless tobacco. He reports that he does not drink alcohol or use drugs.  ALLERGIES:  Allergies  Allergen Reactions  . Ranexa [Ranolazine] Other (See Comments)    [place]    ROS: Pertinent items noted in HPI and remainder of comprehensive ROS otherwise negative.  HOME MEDS: Current Outpatient Medications  Medication Sig Dispense Refill  . aspirin EC 81 MG tablet Take 81 mg by mouth daily.    Marland Kitchen atorvastatin (LIPITOR) 40 MG tablet TAKE 1 TABLET BY MOUTH ONCE DAILY 90 tablet 1  . clopidogrel (PLAVIX) 75 MG tablet TAKE 1 TABLET BY MOUTH ONCE DAILY 90  tablet 1  . isosorbide mononitrate (IMDUR) 60 MG 24 hr tablet TAKE 1 TABLET BY MOUTH IN THE MORNING AND THEN 1/2 (ONE-HALF) TABLET IN THE EVENING (Patient taking differently: 30 mg. TAKE 1/2 (ONE-HALF) TABLET IN THE MORNINGS) 135 tablet 1  . losartan (COZAAR) 25 MG tablet TAKE 1 TABLET BY MOUTH ONCE DAILY 90 tablet 1  . metoprolol tartrate (LOPRESSOR) 25 MG tablet TAKE 1/2 (ONE-HALF) TABLET BY MOUTH TWICE DAILY 90 tablet 1  . nitroGLYCERIN (NITROSTAT) 0.4 MG SL tablet Place 1 tablet (0.4 mg total) under the tongue every 5 (five) minutes as needed for chest pain. (Patient not taking: Reported on 12/27/2018) 25 tablet 3   No current facility-administered medications for this visit.     LABS/IMAGING: No results found for this or any previous visit (from the past 48 hour(s)). No results found.  VITALS: BP (!) 154/88   Pulse (!) 58   Ht  (1.88 m)   Wt 214 lb 9.6 oz (97.3 kg)   BMI 27.55 kg/m   EXAM: General appearance: alert and no distress Neck: no carotid bruit, no JVD and thyroid not enlarged, symmetric, no tenderness/mass/nodules Lungs: clear to auscultation bilaterally Heart: regular rate and rhythm Abdomen: soft, non-tender; bowel sounds normal; no masses,  no organomegaly Extremities: extremities normal, atraumatic, no cyanosis or edema Pulses: 2+ and symmetric Skin: Skin color, texture, turgor normal. No rashes or lesions Neurologic: Grossly normal Psych: Pleasant  EKG: Sinus bradycardia with first-degree AV block, bifascicular block-personally reviewed  ASSESSMENT:  1. CAD s/p PCI to the SVG to RI and SVG to PDA with DES (12/2016) 2. 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 Status post PCI to the SVG to intermediate branch with a Promus Premier DES (11/2014) 5. CAD s/p CABG x 5 in 2003 6. Dyslipidemia 7. Ischemic cardioymyopathy, EF 40-45% (improved to 60-65% in  04/2016) 8. HTN  PLAN: 1.   Earl Keller had a history of multiple PCI's for angina with an ischemic cardiomyopathy however his LV function has improved and his angina has now resolved.  He is exercising regularly.  His weight is up somewhat and his cholesterol was notable for elevated triglycerides.  I like to repeat a lipid profile today.  I gave him dietary information on lowering triglycerides and if he cannot make significant progress in the next 6 months, I would consider adding Vascepa.  Follow-up with me in 6 months.  Chrystie NoseKenneth C. Hilty, MD, [facility] Michigan-Dmc CampusFACC, FACP  [place]  [facility]  Medical Director of the Advanced Lipid Disorders &  Cardiovascular Risk Reduction Clinic Attending Cardiologist  Direct Dial: [phone]  Fax: [phone]  Website:  [email or web address]  Kenneth C Hilty 12/27/2018, 8:39 AM

## 2019-01-06 ENCOUNTER — Other Ambulatory Visit: Payer: Self-pay | Admitting: Internal Medicine

## 2019-07-07 ENCOUNTER — Other Ambulatory Visit: Payer: Self-pay | Admitting: Internal Medicine

## 2019-09-08 ENCOUNTER — Other Ambulatory Visit: Payer: Self-pay

## 2019-09-08 ENCOUNTER — Ambulatory Visit: Payer: Medicare Other | Admitting: Internal Medicine

## 2019-09-08 ENCOUNTER — Encounter: Payer: Self-pay | Admitting: Internal Medicine

## 2019-09-08 VITALS — BP 176/87 | HR 40 | Temp 97.9°F | Ht 74.0 in | Wt 196.4 lb

## 2019-09-08 DIAGNOSIS — Z951 Presence of aortocoronary bypass graft: Secondary | ICD-10-CM

## 2019-09-08 DIAGNOSIS — I2510 Atherosclerotic heart disease of native coronary artery without angina pectoris: Secondary | ICD-10-CM | POA: Diagnosis not present

## 2019-09-08 DIAGNOSIS — I4891 Unspecified atrial fibrillation: Secondary | ICD-10-CM | POA: Diagnosis not present

## 2019-09-08 DIAGNOSIS — E785 Hyperlipidemia, unspecified: Secondary | ICD-10-CM

## 2019-09-08 DIAGNOSIS — I10 Essential (primary) hypertension: Secondary | ICD-10-CM

## 2019-09-08 NOTE — Patient Instructions (Signed)
Medication Instructions:  Your physician recommends that you continue on your current medications as directed. Please refer to the Current Medication list given to you today.  *If you need a refill on your cardiac medications before your next appointment, please call your pharmacy*  Lab Work: NONE If you have labs (blood work) drawn today and your tests are completely normal, you will receive your results only by: Marland Kitchen MyChart Message (if you have MyChart) OR . A paper copy in the mail If you have any lab test that is abnormal or we need to change your treatment, we will call you to review the results.  Testing/Procedures: NONE  Follow-Up: At [facility], you and your health needs are our priority.  As part of our continuing mission to provide you with exceptional heart care, we have created designated Provider Care Teams.  These Care Teams include your primary Cardiologist (physician) and Advanced Practice Providers (APPs -  Physician Assistants and Nurse Practitioners) who all work together to provide you with the care you need, when you need it.  Your next appointment:   3 months  The format for your next appointment:   In Person  Provider:   You may see Dr. Debara Pickett or one of the following Advanced Practice Providers on your designated Care Team:    Almyra Deforest, PA-C  Fabian Sharp, [place] or   Roby Lofts, [place]   Other Instructions

## 2019-09-08 NOTE — Progress Notes (Signed)
OFFICE NOTE  Chief Complaint:  Routine follow-up  Primary Care Physician: Street, Stephanie Coup, MD  HPI:  Earl Keller is a 78 y.o. male with a history of coronary artery bypass grafting in 2003. In 2012 and non-ST elevation myocardial infarction and catheterization at that time showed patent grafts with small vessel disease and distal disease of native vessels. The ejection fraction that time was 45%. The patient has been doing well on medical therapy with no complaints of angina. He states he is quite active does some hiking and biking fairly regularly without any concerns. He currently denies nausea, vomiting, fever, chest pain, shortness of breath, orthopnea, dizziness, PND, cough, congestion, abdominal pain, hematochezia, melena, lower extremity edema, claudication.  He was previously on Ranexa and isosorbide.   I saw [name] back in the office today. He reports that since January he's been developing a dull aching chest pain. The symptoms came on when walking after about a mile and improved a little bit. Recently he's been having symptoms that are lasting more during a walk and eventually has gotten to a point now where he cannot go on longer walks. He called the office and was restarted on his indoor and continues to have chest pain. He's had 2 episodes of chest discomfort in the past 2 days were taken nitroglycerin which provided some relief. We did note that his nitroglycerin as it is somewhat expired however does burn minimally under his tongue. He reported that initially had improvement in his pain however the symptoms came back after about an hour or so. These are occurring now at rest suggesting that this is more significant unstable angina. EKG in the office shows a stable right bundle branch block with no new ischemic changes.  [name] returns today from his recent hospitalization. He was referred in for unstable angina and underwent heart catheterization. This demonstrated the  following:  Procedure Performed:  1. Left Heart Catheterization 2. Selective Coronary Angiography 3. SVG angiography 4. LIMA graft angiography 5. Left ventricular angiogram 6. PTCA/DES x 1 proximal body of SVG to intermediate branch 7. Angioseal right femoral artery  Operator: [name], MD  Indication: 78 yo male with history of 5V CABG in 2003 with most recent cath September 2012 with 4/5 patent grafts (SVG to Diagonal occluded) but noted to have severe disease in native targets with no options for PCI at that time. Recently having chest pain c/w unstable angina.   Procedure Details: The risks, benefits, complications, treatment options, and expected outcomes were discussed with the patient. The patient and/or family concurred with the proposed plan, giving informed consent. The patient was brought to the cath lab after IV hydration was begun and oral premedication was given. The patient was further sedated with Versed and Fentanyl. The right groin was prepped and draped in the usual manner. Using the modified Seldinger access technique, a 5 French sheath was placed in the right femoral artery. Standard diagnostic catheters were used to perform selective coronary angiography. The JR4 catheter was used to engage the RCA, the LIMA graft and all vein grafts. A pigtail catheter was used to perform a left ventricular angiogram. He was found to have severe stenosis in the ostium of the SVG to the intermediate branch. I elected to proceed to PCI of this vein graft.   PCI Note: He was given 600 mg Plavix po x 1. Angiomax weight based bolus given and drip started. When the ACT was over 200, I engaged the  SVG to the intermediate branch with a 6 French LCB guiding catheter. I then passed a Cougar IC wire down the SVG into the target intermediate branch. I did not use a distal protection device given the short distance of the graft to the target. I also needed to  make guide manipulations with the ostial location of the stenosis and did not want to have frequent drift of the distal protection device. A 2.5 x 12 mm balloon was used to pre-dilate the ostial stenosis x 1. I then deployed a 3.0 x 16 mm Promus Premier DES in the ostium of the SVG. The stent was post-dilated x 1 with a 3.25 x 12 mm [place] balloon x 1. The stenosis was taken from 99% down to 10%. He was given 24 mcg IC adenosine. There was excellent flow into the target vessel. Angioseal placed right femoral artery.   There were no immediate complications. The patient was taken to the recovery area in stable condition.   Hemodynamic Findings: Central aortic pressure: 118/58 Left ventricular pressure: 119/6/20  Angiographic Findings:  Left main: 10% ostial stenosis.   Left Anterior Descending Artery: Moderate caliber vessel that courses to the apex. The proximal vessel has diffuse 60-70% stenosis supplying a small diagonal branch. The mid and distal vessel fills antegrade and from the patent IMA graft.   Circumflex Artery: 100% ostial occlusion. The intermediate branch fills from the patent vein graft. The obtuse marginal branch fills from right to left collaterals (through filling of the SVG to the PDA).   Right Coronary Artery: 100% mid occlusion. The PDA and PLA fill from the patent vein graft. There is severe stenosis in the native PDA retrograde from the graft insertion but this is unchanged from last cath in 2012. This leads to the PLA.   Graft Anatomy:  SVG to Diagonal is occluded SVG sequential to intermediate branch and obtuse marginal is patent to the intermediate but occluded distal limb to the obtuse marginal.  SVG to PDA is patent with 50% anastomosis lesion into the PDA (unchanged from last cath) and as noted above, there is severe disease in the PDA in the more proximal portion of the vessel than where the graft inserts. This supplies the PLA retrograde and is unchanged from last  cath. (Unfavorable target for PCI).  LIMA graft to mid LAD is patent. 40% ostial stenosis IMA, unchanged from last cath.   Left Ventricular Angiogram: LVEF=40% with anterior wall hypokinesis. Mild MR.   Impression: 1. 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  2. 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 Mild to moderate LV systolic dysfunction 4. Successful PTCA/DES x 1 ostium of SVG to intermediate branch.   Recommendations: He will need dual anti-platelet therapy with ASA and Plavix for at least one year.    Complications: None. The patient tolerated the procedure well.  Since his percutaneous intervention to the vein graft to the intermediate branch she is doing much better. He denies any further angina. Unfortunately, his main complaint today is that he has pain in the first MTP joint of the right foot with redness and swelling as well as warmth. This has been going on for couple days and is difficult for him to ambulate. He says he's had some episode like this about a year ago in the left foot. He denies any prior diagnosis of gout. He does carry a history of rheumatoid arthritis, the details of which are not totally clear to  me. I'm not sure if he is followed up by a rheumatologist.  Mr. Mishkin returns today for follow-up. He reports that his gout has resolved. His uric acid level was elevated at 8.0 however he was not back to see his primary care provider for treatment. Unfortunately, an EKG in the office today shows either atypical atrial flutter or coarse atrial fibrillation. This is a new diagnosis for him. I showed him the EKGs and explained atrial fibrillation in great detail including the increased risk for stroke. He seemed to have a difficult time understanding the need to be on anticoagulation. We did discuss the elevated bleeding risk that he may face since he's currently on aspirin and Plavix for a stent that was placed in February.  He seems to be asymptomatic although recently has had some more fatigue which could be attributable to this. He denies any chest pain.   I saw Mr. Spainhour back today in the office. Fortunately he has converted back to sinus rhythm. 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 Unfortunately he did not start the blood thinner. He told me he has a strong family history of bleeding and his father almost bled to death on warfarin. He is also had some GI bleeding and is very concerned about the medication. He understands that his CHADSVASC score is 3, therefore increasing his wrist to about 5% annually for stroke. Despite these warnings he is adamant against taking a blood thinner. He understands the stroke could be physically disabling and even lead to death.  04-11-2016  Mr. Barnhard returns today for follow-up. He is without complaints at this time. He continues to decline taking anticoagulation because of concerns. He understands that he is at increased stroke risk with his A. fib. He fortunately has not had recurrent A. fib that was not aware of it immediately. EKG shows stable sinus bradycardia with first-degree AV block. On his heart catheterization 1 year ago it was noted that EF was low at 40-45%. This has not been reassessed since that time.  11/30/2016   Mr. Madrigal returns today for follow-up. He reports she's recently been having some symptoms he's concerned with being angina. He says with exercising on the treadmill he gets some chest discomfort. It tends to get better with rest. He has had to take nitroglycerin 3 times in the last month. He also has some difficulty swallowing. Occasionally this gives him pressure in his chest. He says food sometimes gets stuck when swallowing however he seems to be able to swallow better with hot liquids. He has a remote history of EGD in the past but is never had a documented stricture or dilatation of the esophagus.  12/28/2016  Mr. Bolduc returns today for follow-up. At his last  office visit I made an increase in his nitroglycerin however this is not significantly improved his angina. He still gets chest pain with exertion and particular with exercise. He says over the past several months his continue to limit his ability to exert himself. Based on this his findings are consistent with unstable angina. He has been intolerant to Ranexa in the past. Options seem to be somewhat limited for medical therapy at this point and I am recommending a cardiac catheterization.  04/20/2017  Mr. Santana was seen today in follow-up. He is doing much better after his recent stenting in March. He was seen in follow-up by Azalee Course, PA-C. He was doing well at that time. Since then his continue to do well. He exercises without limitations.  He is able to do yard work without any problems. Unfortunately he's had aggressive recurrent to coronary disease. His LDL-C has been <70 on atorvastatin 40 mg daily. I'm wondering if we are missing something on his lipid profile - for example, elevated LP(a) which has shown to predict early graft failure if elevated. He is also enquiring today about reducing the dose of the isosorbide - I think this is reasonable.  10/14/2017  Mr. Bonine returns today for follow-up.  It has been 6 months since I last saw him.  He continues to be chest pain-free.  He denies any worsening shortness of breath.  He had PCI in March 2018.  He is able to go back to most of his yard work and exercise.  He had a repeat lipid profile recently in June 2018 which showed an LDL-P of 839 and small LDL particle number of 319.  Overall this is a very favorable profile.  Pressure was initially elevated 158/78 however came down to 128/84.  05/18/2018  Seen today in follow-up. No chest pain. Has gained about 5-6 lbs. Reports snacking more at night. Walks on a daily basis. Had a well-controlled lipid profile a year ago. Will need a recheck. BP elevated today - did come down again to 673 systolic .Marland Kitchen He says  it is in the 120's at home.  12/27/2018  Mr. Knecht is seen today in follow-up.  Overall he is doing well.  He denies any chest pain or shortness of breath.  He says he works daily for about an hour and has no symptoms with that.  It was noted last summer that his triglycerides were elevated 216.  LDL is at goal 64 total cholesterol 141.  Since then there is been about 8 pound weight gain.  Although he exercises, suspect diet could be improved somewhat.  I think this represents some persistent cardiovascular risk.  Blood pressure was elevated today however he said he did not take his medications this morning.  He is fasting.  09/08/2019  Mr. Arment returns today for follow-up.  Overall he says he is doing well.  He has no complaints of shortness of breath or worsening fatigue.  He says he is exercising actually a lot more than he had in the past.  Interestingly, however his EKG today shows that he is in A. fib with a slow ventricular response and right bundle branch block.  Heart rate is 47.  Although he has a history of PAF in the past is not had any very recently and therefore has not been on anticoagulation.  With this diagnosis, we discussed at great length today's increased stroke risk.  His CHA2DS2-VASc score is 4, for age, hypertension and coronary disease.  I am recommending starting Eliquis and discontinue his Plavix.  Then we would likely plan to see him back in a month and if he was still in A. fib consider cardioversion.  He however is not in agreement with this.  He notes that in the past many years ago he had a bleeding ulcer for which she required a transfusion.  He is concerned about restarting that.  He also noted that his father had a stroke at similar age and was placed on warfarin and ultimately had significant bleeding with that and died.  Based on that, he is declining anticoagulation at this time.  He understands that he may be at increased risk for stroke which could be disabling or  fatal.  PMHx:  Past Medical History:  Diagnosis Date  . CAD (coronary artery disease)    a. 2003: s/p CABG x5V  b. 06/2011: NSTEMI, 4/5 patent grafts (SVG to Diag occluded)- no good PCI targets.  c. [place] s/p [place] to SVG--> intermed    d. 12/2016[place] s/p [place] to SVG--> PDA and PCTA to SVG--> intermed  . Complication of anesthesia    "came to before they took the ETT out after OHS"  . Dyslipidemia   . GERD (gastroesophageal reflux disease)   . History of blood transfusion 06/2010   related to "stomach bleeding"  . History of gout 11/2014  . Hypertension   . NSTEMI (non-ST elevated myocardial infarction) (HCC) 06/2011   Hattie Perch/notes 07/23/2011  . PAF (paroxysmal atrial fibrillation) (HCC)   . RA (rheumatoid arthritis) (HCC)    "a little in my hands" (01/06/2017)  . Trouble swallowing    "comes and goes" (01/06/2017)  . Upper GI bleed 07/2010   "went in w/a scope and clipped a bleeding vessel"    Past Surgical History:  Procedure Laterality Date  . CARDIAC CATHETERIZATION  2012   showed patent grafts with small- vessel disease and distal disease of the native vessels. 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 CARDIAC CATHETERIZATION  11/30/2014   Procedure: LEFT HEART CATH AND CORS/GRAFTS ANGIOGRAPHY;  Surgeon: Kathleene Hazelhristopher D McAlhany, MD;  [facility];  Service: Cardiovascular;;  . CARDIAC CATHETERIZATION  11/30/2014   Procedure: CORONARY STENT INTERVENTION;  Surgeon: Kathleene Hazelhristopher D McAlhany, MD;  [facility];  Service: Cardiovascular;;  SVG to RI  . CORONARY ANGIOPLASTY WITH STENT PLACEMENT  11/30/2014   PTCA/DES OSLIUM  OF SVG  . CORONARY ANGIOPLASTY WITH STENT PLACEMENT  01/06/2017  . CORONARY ARTERY BYPASS GRAFT  01/2002   EF 45%; "before bypass"  . CORONARY STENT  INTERVENTION N/A 01/06/2017   Procedure: Coronary Stent Intervention;  Surgeon: Kathleene Hazelhristopher D McAlhany, MD;  [facility];  Service: Cardiovascular;  Laterality: N/A;  . LEFT HEART CATH AND CORS/GRAFTS ANGIOGRAPHY N/A 01/06/2017   Procedure: Left Heart Cath and Cors/Grafts Angiography;  Surgeon: Kathleene Hazelhristopher D McAlhany, MD;  [facility];  Service: Cardiovascular;  Laterality: N/A;  . TONSILLECTOMY      FAMHx:  Family History  Problem Relation Age of Onset  . Sudden death Mother [id number]  . Liver disease Maternal Grandmother   . Anuerysm Maternal Grandfather   . Stroke Paternal Grandmother   . Heart attack Paternal Grandfather     SOCHx:   reports that he has never smoked. He has never used smokeless tobacco. He reports that he does not drink alcohol or use drugs.  ALLERGIES:  Allergies  Allergen Reactions  . Ranexa [Ranolazine] Other (See Comments)    [place]    ROS: Pertinent items noted in HPI and remainder of comprehensive ROS otherwise negative.  HOME MEDS: Current Outpatient Medications  Medication Sig Dispense Refill  . aspirin EC 81 MG tablet Take 81 mg by mouth daily.    Marland Kitchen. atorvastatin (LIPITOR) 40 MG tablet Take 1 tablet by mouth once daily 90 tablet 0  . clopidogrel (PLAVIX) 75 MG tablet Take 1 tablet by mouth once daily 90 tablet 0  . isosorbide mononitrate (IMDUR) 60 MG 24 hr tablet  TAKE 1 TABLET BY MOUTH IN THE MORNING AND THEN 1/2 (ONE-HALF) TABLET IN THE EVENING (Patient taking differently: 30 mg. TAKE 1/2 (ONE-HALF) TABLET IN THE MORNINGS) 135 tablet 1  . losartan (COZAAR) 25 MG tablet Take 1 tablet by mouth once daily 90 tablet 0  . metoprolol tartrate (LOPRESSOR) 25 MG tablet Take 1/2 (one-half) tablet by mouth twice daily 90 tablet 0  . nitroGLYCERIN (NITROSTAT) 0.4 MG SL tablet Place 1 tablet (0.4 mg total) under the tongue every 5 (five) minutes as needed for chest pain. 25 tablet 3   No current facility-administered  medications for this visit.     LABS/IMAGING: No results found for this or any previous visit (from the past 48 hour(s)). No results found.  VITALS: BP (!) 176/87   Pulse (!) 40   Temp 97.9 F (36.6 C)   Ht 6\' 2"  (1.88 m)   Wt 196 lb 6.4 oz (89.1 kg)   SpO2 95%   BMI 25.22 kg/m   EXAM: General appearance: alert and no distress Neck: no carotid bruit, no JVD and thyroid not enlarged, symmetric, no tenderness/mass/nodules Lungs: clear to auscultation bilaterally Heart: regular rate and rhythm Abdomen: soft, non-tender; bowel sounds normal; no masses,  no organomegaly Extremities: extremities normal, atraumatic, no cyanosis or edema Pulses: 2+ and symmetric Skin: Skin color, texture, turgor normal. No rashes or lesions Neurologic: Grossly normal Psych: Pleasant  EKG: A. fib with slow ventricular spots of 47, RBBB-personally reviewed  ASSESSMENT: 1. CAD s/p PCI to the SVG to RI and SVG to PDA with DES (12/2016) 2. 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 Status post PCI to the SVG to intermediate branch with a Promus Premier DES (11/2014) 5. CAD s/p CABG x 5 in 2003 6. Dyslipidemia 7. Ischemic cardioymyopathy, EF 40-45% (improved to 60-65% in 04/2016) 8. HTN  PLAN: 1.   Mr. Pascall continues to have some recurrent A. fib or flutter.  He is in slow A. fib today.  He is declined anticoagulation in the past and continues to decline it.  He is at increased risk for stroke.  His chads vas score is now 37 given age over 33.  Unless he changes his mind, we will continue with his current therapies.  Follow-up with me in 3 months.  Chrystie Nose, MD, [facility], FACP  [place]  [facility]  Medical Director of the [facility]  [facility] Attending Cardiologist  Direct Dial: [phone]  Fax: [phone]  Website:  [email or web address]   09/08/2019, 9:31 AM

## 2019-10-11 ENCOUNTER — Other Ambulatory Visit: Payer: Self-pay | Admitting: Internal Medicine

## 2019-12-07 ENCOUNTER — Encounter (INDEPENDENT_AMBULATORY_CARE_PROVIDER_SITE_OTHER): Payer: Self-pay

## 2019-12-07 ENCOUNTER — Encounter: Payer: Self-pay | Admitting: Internal Medicine

## 2019-12-07 ENCOUNTER — Ambulatory Visit: Payer: Medicare PPO | Admitting: Internal Medicine

## 2019-12-07 ENCOUNTER — Other Ambulatory Visit: Payer: Self-pay

## 2019-12-07 VITALS — BP 158/84 | HR 36 | Ht 74.0 in | Wt 200.8 lb

## 2019-12-07 DIAGNOSIS — E785 Hyperlipidemia, unspecified: Secondary | ICD-10-CM | POA: Diagnosis not present

## 2019-12-07 DIAGNOSIS — I4811 Longstanding persistent atrial fibrillation: Secondary | ICD-10-CM | POA: Diagnosis not present

## 2019-12-07 DIAGNOSIS — Z951 Presence of aortocoronary bypass graft: Secondary | ICD-10-CM

## 2019-12-07 DIAGNOSIS — I480 Paroxysmal atrial fibrillation: Secondary | ICD-10-CM

## 2019-12-07 DIAGNOSIS — I2510 Atherosclerotic heart disease of native coronary artery without angina pectoris: Secondary | ICD-10-CM

## 2019-12-07 NOTE — Progress Notes (Signed)
OFFICE NOTE  Chief Complaint:  Routine follow-up  Primary Care Physician: Chrystie Nose, MD  HPI:  Earl Keller is a 79 y.o. male with a history of coronary artery bypass grafting in 2003. In 2012 and non-ST elevation myocardial infarction and catheterization at that time showed patent grafts with small vessel disease and distal disease of native vessels. The ejection fraction that time was 45%. The patient has been doing well on medical therapy with no complaints of angina. He states he is quite active does some hiking and biking fairly regularly without any concerns. He currently denies nausea, vomiting, fever, chest pain, shortness of breath, orthopnea, dizziness, PND, cough, congestion, abdominal pain, hematochezia, melena, lower extremity edema, claudication.  He was previously on Ranexa and isosorbide.   I saw Earl Keller back in the office today. He reports that since January he's been developing a dull aching chest pain. The symptoms came on when walking after about a mile and improved a little bit. Recently he's been having symptoms that are lasting more during a walk and eventually has gotten to a point now where he cannot go on longer walks. He called the office and was restarted on his indoor and continues to have chest pain. He's had 2 episodes of chest discomfort in the past 2 days were taken nitroglycerin which provided some relief. We did note that his nitroglycerin as it is somewhat expired however does burn minimally under his tongue. He reported that initially had improvement in his pain however the symptoms came back after about an hour or so. These are occurring now at rest suggesting that this is more significant unstable angina. EKG in the office shows a stable right bundle branch block with no new ischemic changes.  Earl Keller returns today from his recent hospitalization. He was referred in for unstable angina and underwent heart catheterization. This demonstrated the  following:  Procedure Performed:  1. Left Heart Catheterization 2. Selective Coronary Angiography 3. SVG angiography 4. LIMA graft angiography 5. Left ventricular angiogram 6. PTCA/DES x 1 proximal body of SVG to intermediate branch 7. Angioseal right femoral artery  Operator: [name], MD  Indication: 79 yo male with history of 5V CABG in 2003 with most recent cath September 2012 with 4/5 patent grafts (SVG to Diagonal occluded) but noted to have severe disease in native targets with no options for PCI at that time. Recently having chest pain c/w unstable angina.   Procedure Details: The risks, benefits, complications, treatment options, and expected outcomes were discussed with the patient. The patient and/or family concurred with the proposed plan, giving informed consent. The patient was brought to the cath lab after IV hydration was begun and oral premedication was given. The patient was further sedated with Versed and Fentanyl. The right groin was prepped and draped in the usual manner. Using the modified Seldinger access technique, a 5 French sheath was placed in the right femoral artery. Standard diagnostic catheters were used to perform selective coronary angiography. The JR4 catheter was used to engage the RCA, the LIMA graft and all vein grafts. A pigtail catheter was used to perform a left ventricular angiogram. He was found to have severe stenosis in the ostium of the SVG to the intermediate branch. I elected to proceed to PCI of this vein graft.   PCI Note: He was given 600 mg Plavix po x 1. Angiomax weight based bolus given and drip started. When the ACT was over 200, I engaged the  SVG to the intermediate branch with a 6 French LCB guiding catheter. I then passed a Cougar IC wire down the SVG into the target intermediate branch. I did not use a distal protection device given the short distance of the graft to the target. I also needed to  make guide manipulations with the ostial location of the stenosis and did not want to have frequent drift of the distal protection device. A 2.5 x 12 mm balloon was used to pre-dilate the ostial stenosis x 1. I then deployed a 3.0 x 16 mm Promus Premier DES in the ostium of the SVG. The stent was post-dilated x 1 with a 3.25 x 12 mm [place] balloon x 1. The stenosis was taken from 99% down to 10%. He was given 24 mcg IC adenosine. There was excellent flow into the target vessel. Angioseal placed right femoral artery.   There were no immediate complications. The patient was taken to the recovery area in stable condition.   Hemodynamic Findings: Central aortic pressure: 118/58 Left ventricular pressure: 119/6/20  Angiographic Findings:  Left main: 10% ostial stenosis.   Left Anterior Descending Artery: Moderate caliber vessel that courses to the apex. The proximal vessel has diffuse 60-70% stenosis supplying a small diagonal branch. The mid and distal vessel fills antegrade and from the patent IMA graft.   Circumflex Artery: 100% ostial occlusion. The intermediate branch fills from the patent vein graft. The obtuse marginal branch fills from right to left collaterals (through filling of the SVG to the PDA).   Right Coronary Artery: 100% mid occlusion. The PDA and PLA fill from the patent vein graft. There is severe stenosis in the native PDA retrograde from the graft insertion but this is unchanged from last cath in 2012. This leads to the PLA.   Graft Anatomy:  SVG to Diagonal is occluded SVG sequential to intermediate branch and obtuse marginal is patent to the intermediate but occluded distal limb to the obtuse marginal.  SVG to PDA is patent with 50% anastomosis lesion into the PDA (unchanged from last cath) and as noted above, there is severe disease in the PDA in the more proximal portion of the vessel than where the graft inserts. This supplies the PLA retrograde and is unchanged from last  cath. (Unfavorable target for PCI).  LIMA graft to mid LAD is patent. 40% ostial stenosis IMA, unchanged from last cath.   Left Ventricular Angiogram: LVEF=40% with anterior wall hypokinesis. Mild MR.   Impression: 1. 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  2. 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 Mild to moderate LV systolic dysfunction 4. Successful PTCA/DES x 1 ostium of SVG to intermediate branch.   Recommendations: He will need dual anti-platelet therapy with ASA and Plavix for at least one year.    Complications: None. The patient tolerated the procedure well.  Since his percutaneous intervention to the vein graft to the intermediate branch she is doing much better. He denies any further angina. Unfortunately, his main complaint today is that he has pain in the first MTP joint of the right foot with redness and swelling as well as warmth. This has been going on for couple days and is difficult for him to ambulate. He says he's had some episode like this about a year ago in the left foot. He denies any prior diagnosis of gout. He does carry a history of rheumatoid arthritis, the details of which are not totally clear to  me. I'm not sure if he is followed up by a rheumatologist.  Earl Keller returns today for follow-up. He reports that his gout has resolved. His uric acid level was elevated at 8.0 however he was not back to see his primary care provider for treatment. Unfortunately, an EKG in the office today shows either atypical atrial flutter or coarse atrial fibrillation. This is a new diagnosis for him. I showed him the EKGs and explained atrial fibrillation in great detail including the increased risk for stroke. He seemed to have a difficult time understanding the need to be on anticoagulation. We did discuss the elevated bleeding risk that he may face since he's currently on aspirin and Plavix for a stent that was placed in February.  He seems to be asymptomatic although recently has had some more fatigue which could be attributable to this. He denies any chest pain.   I saw Earl Keller back today in the office. Fortunately he has converted back to sinus rhythm. 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 Unfortunately he did not start the blood thinner. He told me he has a strong family history of bleeding and his father almost bled to death on warfarin. He is also had some GI bleeding and is very concerned about the medication. He understands that his CHADSVASC score is 3, therefore increasing his wrist to about 5% annually for stroke. Despite these warnings he is adamant against taking a blood thinner. He understands the stroke could be physically disabling and even lead to death.  04-11-2016  Earl Keller returns today for follow-up. He is without complaints at this time. He continues to decline taking anticoagulation because of concerns. He understands that he is at increased stroke risk with his A. fib. He fortunately has not had recurrent A. fib that was not aware of it immediately. EKG shows stable sinus bradycardia with first-degree AV block. On his heart catheterization 1 year ago it was noted that EF was low at 40-45%. This has not been reassessed since that time.  11/30/2016   Earl Keller returns today for follow-up. He reports she's recently been having some symptoms he's concerned with being angina. He says with exercising on the treadmill he gets some chest discomfort. It tends to get better with rest. He has had to take nitroglycerin 3 times in the last month. He also has some difficulty swallowing. Occasionally this gives him pressure in his chest. He says food sometimes gets stuck when swallowing however he seems to be able to swallow better with hot liquids. He has a remote history of EGD in the past but is never had a documented stricture or dilatation of the esophagus.  12/28/2016  Earl Keller returns today for follow-up. At his last  office visit I made an increase in his nitroglycerin however this is not significantly improved his angina. He still gets chest pain with exertion and particular with exercise. He says over the past several months his continue to limit his ability to exert himself. Based on this his findings are consistent with unstable angina. He has been intolerant to Ranexa in the past. Options seem to be somewhat limited for medical therapy at this point and I am recommending a cardiac catheterization.  04/20/2017  Earl Keller was seen today in follow-up. He is doing much better after his recent stenting in March. He was seen in follow-up by Azalee Course, PA-C. He was doing well at that time. Since then his continue to do well. He exercises without limitations.  He is able to do yard work without any problems. Unfortunately he's had aggressive recurrent to coronary disease. His LDL-C has been <70 on atorvastatin 40 mg daily. I'm wondering if we are missing something on his lipid profile - for example, elevated LP(a) which has shown to predict early graft failure if elevated. He is also enquiring today about reducing the dose of the isosorbide - I think this is reasonable.  10/14/2017  Earl Keller returns today for follow-up.  It has been 6 months since I last saw him.  He continues to be chest pain-free.  He denies any worsening shortness of breath.  He had PCI in March 2018.  He is able to go back to most of his yard work and exercise.  He had a repeat lipid profile recently in June 2018 which showed an LDL-P of 839 and small LDL particle number of 319.  Overall this is a very favorable profile.  Pressure was initially elevated 158/78 however came down to 128/84.  05/18/2018  Seen today in follow-up. No chest pain. Has gained about 5-6 lbs. Reports snacking more at night. Walks on a daily basis. Had a well-controlled lipid profile a year ago. Will need a recheck. BP elevated today - did come down again to 140 systolic .Marland Kitchen He says  it is in the 120's at home.  12/27/2018  Earl Keller is seen today in follow-up.  Overall he is doing well.  He denies any chest pain or shortness of breath.  He says he works daily for about an hour and has no symptoms with that.  It was noted last summer that his triglycerides were elevated 216.  LDL is at goal 64 total cholesterol 141.  Since then there is been about 8 pound weight gain.  Although he exercises, suspect diet could be improved somewhat.  I think this represents some persistent cardiovascular risk.  Blood pressure was elevated today however he said he did not take his medications this morning.  He is fasting.  09/08/2019  Earl Keller returns today for follow-up.  Overall he says he is doing well.  He has no complaints of shortness of breath or worsening fatigue.  He says he is exercising actually a lot more than he had in the past.  Interestingly, however his EKG today shows that he is in A. fib with a slow ventricular response and right bundle branch block.  Heart rate is 47.  Although he has a history of PAF in the past is not had any very recently and therefore has not been on anticoagulation.  With this diagnosis, we discussed at great length today's increased stroke risk.  His CHA2DS2-VASc score is 4, for age, hypertension and coronary disease.  I am recommending starting Eliquis and discontinue his Plavix.  Then we would likely plan to see him back in a month and if he was still in A. fib consider cardioversion.  He however is not in agreement with this.  He notes that in the past many years ago he had a bleeding ulcer for which she required a transfusion.  He is concerned about restarting that.  He also noted that his father had a stroke at similar age and was placed on warfarin and ultimately had significant bleeding with that and died.  Based on that, he is declining anticoagulation at this time.  He understands that he may be at increased risk for stroke which could be disabling or  fatal.  12/07/2019  Earl Keller returns  today for follow-up.  Heart rate is notably lower today at 36 and a slow A. fib.  He is on 12.5 mg of metoprolol twice daily.  I advised him to decrease the dose or go to half of a 25 mg Toprol-XL once a day however he declined.  He says he is felt the best he has felt in years.  I am concerned about absolute bradycardia and explained to him the risks of possible syncope, heart failure or sudden cardiac event related to bradycardia.  In addition we revisited anticoagulation.  His CHADSVASC score is 4.  He is in persistent atrial fibrillation.  His current therapy of aspirin and Plavix or not significant to prevent stroke.  He understands this but does not want to be on anticoagulation.  PMHx:  Past Medical History:  Diagnosis Date  . CAD (coronary artery disease)    a. 2003: s/p CABG x5V  b. 06/2011: NSTEMI, 4/5 patent grafts (SVG to Diag occluded)- no good PCI targets.  c. 11/2014: [place]: s/p DES to SVG--> intermed    d. 12/2016: [place] s/p DES to SVG--> PDA and PCTA to SVG--> intermed  . Complication of anesthesia    "came to before they took the ETT out after OHS"  . Dyslipidemia   . GERD (gastroesophageal reflux disease)   . History of blood transfusion 06/2010   related to "stomach bleeding"  . History of gout 11/2014  . Hypertension   . NSTEMI (non-ST elevated myocardial infarction) (HCC) 06/2011   Hattie Perch 07/23/2011  . PAF (paroxysmal atrial fibrillation) (HCC)   . RA (rheumatoid arthritis) (HCC)    "a little in my hands" (01/06/2017)  . Trouble swallowing    "comes and goes" (01/06/2017)  . Upper GI bleed 07/2010   "went in w/a scope and clipped a bleeding vessel"    Past Surgical History:  Procedure Laterality Date  . CARDIAC CATHETERIZATION  2012   showed patent grafts with small- vessel disease and distal disease of the native vessels. There is also some osteal narrowing of his internal mammary artery and the vein graft to the OM and the distal  anastomotic site of the vein to the RCA, there was nothing that needed to be intervened upon medically, his EF was still around 45%  . CARDIAC CATHETERIZATION  11/30/2014   Procedure: LEFT HEART CATH AND CORS/GRAFTS ANGIOGRAPHY;  Surgeon: Kathleene Hazel, MD;  Location: [facility] CATH LAB;  Service: Cardiovascular;;  . CARDIAC CATHETERIZATION  11/30/2014   Procedure: CORONARY STENT INTERVENTION;  Surgeon: Kathleene Hazel, MD;  Location: [facility] CATH LAB;  Service: Cardiovascular;;  SVG to RI  . CORONARY ANGIOPLASTY WITH STENT PLACEMENT  11/30/2014   PTCA/DES OSLIUM  OF SVG  . CORONARY ANGIOPLASTY WITH STENT PLACEMENT  01/06/2017  . CORONARY ARTERY BYPASS GRAFT  01/2002   EF 45%; "before bypass"  . CORONARY STENT INTERVENTION N/A 01/06/2017   Procedure: Coronary Stent Intervention;  Surgeon: Kathleene Hazel, MD;  Location: [facility] INVASIVE CV LAB;  Service: Cardiovascular;  Laterality: N/A;  . LEFT HEART CATH AND CORS/GRAFTS ANGIOGRAPHY N/A 01/06/2017   Procedure: Left Heart Cath and Cors/Grafts Angiography;  Surgeon: Kathleene Hazel, MD;  Location: [facility] INVASIVE CV LAB;  Service: Cardiovascular;  Laterality: N/A;  . TONSILLECTOMY      FAMHx:  Family History  Problem Relation Age of Onset  . Sudden death Mother 18  . Liver disease Maternal Grandmother   . Anuerysm Maternal Grandfather   . Stroke Paternal Grandmother   .  Heart attack Paternal Grandfather     SOCHx:   reports that he has never smoked. He has never used smokeless tobacco. He reports that he does not drink alcohol or use drugs.  ALLERGIES:  Allergies  Allergen Reactions  . Ranexa [Ranolazine] Other (See Comments)    [place]    ROS: Pertinent items noted in HPI and remainder of comprehensive ROS otherwise negative.  HOME MEDS: Current Outpatient Medications  Medication Sig Dispense Refill  . aspirin EC 81 MG tablet Take 81 mg by mouth daily.    Marland Kitchen atorvastatin (LIPITOR) 40 MG tablet Take 1 tablet by mouth  once daily 90 tablet 1  . clopidogrel (PLAVIX) 75 MG tablet Take 1 tablet by mouth once daily 90 tablet 1  . isosorbide mononitrate (IMDUR) 60 MG 24 hr tablet TAKE 1 TABLET BY MOUTH IN THE MORNING AND 1/2 (ONE-HALF) TABLET IN THE EVENING 135 tablet 1  . losartan (COZAAR) 25 MG tablet Take 1 tablet by mouth once daily 90 tablet 1  . metoprolol tartrate (LOPRESSOR) 25 MG tablet Take 1/2 (one-half) tablet by mouth twice daily 90 tablet 1  . nitroGLYCERIN (NITROSTAT) 0.4 MG SL tablet Place 1 tablet (0.4 mg total) under the tongue every 5 (five) minutes as needed for chest pain. 25 tablet 3   No current facility-administered medications for this visit.    LABS/IMAGING: No results found for this or any previous visit (from the past 48 hour(s)). No results found.  VITALS: BP (!) 158/84   Pulse (!) 36   Ht 6\' 2"  (1.88 m)   Wt 200 lb 12.8 oz (91.1 kg)   SpO2 97%   BMI 25.78 kg/m   EXAM: General appearance: alert and no distress Neck: no carotid bruit, no JVD and thyroid not enlarged, symmetric, no tenderness/mass/nodules Lungs: clear to auscultation bilaterally Heart: regular rate and rhythm Abdomen: soft, non-tender; bowel sounds normal; no masses,  no organomegaly Extremities: extremities normal, atraumatic, no cyanosis or edema Pulses: 2+ and symmetric Skin: Skin color, texture, turgor normal. No rashes or lesions Neurologic: Grossly normal Psych: Pleasant  EKG: Atrial flutter with variable AV block at 36, RBBB-personally reviewed  ASSESSMENT: 1. CAD s/p PCI to the SVG to RI and SVG to PDA with DES (12/2016) 2. 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 Status post PCI to the SVG to intermediate branch with a Promus Premier DES (11/2014) 5. CAD s/p CABG x 5 in 2003 6. Dyslipidemia 7. Ischemic cardioymyopathy, EF 40-45% (improved to 60-65% in 04/2016) 8. HTN 9. RBBB  PLAN: 1.   Earl Keller has had  some progressive decline in heart rate.  Today 36 on beta-blocker.  I advised either discontinuing or reducing the dose of his beta-blocker however he says he "feels the best he has in years".  There is clearly been a decline in heart rate tracking with his visits over the past several months.  I strongly advised discontinuing or reducing the beta-blocker but again he declined.  I have also again recommended discontinuing Plavix and possibly aspirin and switching him to Eliquis for anticoagulation given his atrial fibrillation.  I do not feel he is adequately protected given his CHA2DS2-VASc score of 4.  He declined this as well.  We will plan follow-up in 6 months but he is advised to call Luan Pulling sooner if he becomes symptomatic.  [place], MD, [facility], FACP  [place]  [facility]  Medical  Director of the [facility] Attending Cardiologist  Direct Dial: [phone]  Fax: [phone]  Website:  [email or web address] Diana 12/07/2019, 9:28 AM

## 2019-12-07 NOTE — Patient Instructions (Signed)
Medication Instructions:  Your physician recommends that you continue on your current medications as directed. Please refer to the Current Medication list given to you today.  *If you need a refill on your cardiac medications before your next appointment, please call your pharmacy*  Lab Work: LIPID & CMET today   If you have labs (blood work) drawn today and your tests are completely normal, you will receive your results only by: Marland Kitchen MyChart Message (if you have MyChart) OR . A paper copy in the mail If you have any lab test that is abnormal or we need to change your treatment, we will call you to review the results.  Testing/Procedures: NONE  Follow-Up: At [facility], you and your health needs are our priority.  As part of our continuing mission to provide you with exceptional heart care, we have created designated Provider Care Teams.  These Care Teams include your primary Cardiologist (physician) and Advanced Practice Providers (APPs -  Physician Assistants and Nurse Practitioners) who all work together to provide you with the care you need, when you need it.  Your next appointment:   6 month(s)  The format for your next appointment:   In Person  Provider:   You may see Dr. Rennis Golden or one of the following Advanced Practice Providers on your designated Care Team:    Azalee Course, PA-C  Micah Flesher, [place] or   Judy Pimple, [place]   Other Instructions

## 2019-12-08 LAB — COMPREHENSIVE METABOLIC PANEL
ALT: 30 IU/L (ref 0–44)
AST: 26 IU/L (ref 0–40)
Albumin/Globulin Ratio: 2 (ref 1.2–2.2)
Albumin: 4.5 g/dL (ref 3.7–4.7)
Alkaline Phosphatase: 74 IU/L (ref 39–117)
BUN/Creatinine Ratio: 18 (ref 10–24)
BUN: 21 mg/dL (ref 8–27)
Bilirubin Total: 0.7 mg/dL (ref 0.0–1.2)
CO2: 22 mmol/L (ref 20–29)
Calcium: 9.9 mg/dL (ref 8.6–10.2)
Chloride: 108 mmol/L — ABNORMAL HIGH (ref 96–106)
Creatinine, Ser: 1.17 mg/dL (ref 0.76–1.27)
GFR calc Af Amer: 70 mL/min/{1.73_m2} (ref >59)
GFR calc non Af Amer: 60 mL/min/{1.73_m2} (ref >59)
Globulin, Total: 2.2 g/dL (ref 1.5–4.5)
Glucose: 110 mg/dL — ABNORMAL HIGH (ref 65–99)
Potassium: 4.8 mmol/L (ref 3.5–5.2)
Sodium: 144 mmol/L (ref 134–144)
Total Protein: 6.7 g/dL (ref 6.0–8.5)

## 2019-12-08 LAB — LIPID PANEL
Chol/HDL Ratio: 3 ratio (ref 0.0–5.0)
Cholesterol, Total: 110 mg/dL (ref 100–199)
HDL: 37 mg/dL — ABNORMAL LOW (ref >39)
LDL Chol Calc (NIH): 59 mg/dL (ref 0–99)
Triglycerides: 67 mg/dL (ref 0–149)
VLDL Cholesterol Cal: 14 mg/dL (ref 5–40)

## 2019-12-13 ENCOUNTER — Encounter: Payer: Self-pay | Admitting: Internal Medicine

## 2020-04-16 ENCOUNTER — Other Ambulatory Visit: Payer: Self-pay | Admitting: Internal Medicine

## 2020-05-17 ENCOUNTER — Other Ambulatory Visit: Payer: Self-pay | Admitting: Internal Medicine

## 2020-08-05 ENCOUNTER — Other Ambulatory Visit: Payer: Self-pay | Admitting: Internal Medicine

## 2020-08-07 ENCOUNTER — Ambulatory Visit: Payer: Medicare PPO | Admitting: Internal Medicine

## 2020-08-07 ENCOUNTER — Other Ambulatory Visit: Payer: Self-pay

## 2020-08-07 ENCOUNTER — Encounter: Payer: Self-pay | Admitting: Internal Medicine

## 2020-08-07 VITALS — BP 144/82 | HR 35 | Temp 96.3°F | Ht 74.0 in | Wt 201.0 lb

## 2020-08-07 DIAGNOSIS — R001 Bradycardia, unspecified: Secondary | ICD-10-CM | POA: Diagnosis not present

## 2020-08-07 DIAGNOSIS — Z951 Presence of aortocoronary bypass graft: Secondary | ICD-10-CM | POA: Diagnosis not present

## 2020-08-07 DIAGNOSIS — I495 Sick sinus syndrome: Secondary | ICD-10-CM | POA: Diagnosis not present

## 2020-08-07 DIAGNOSIS — I480 Paroxysmal atrial fibrillation: Secondary | ICD-10-CM | POA: Diagnosis not present

## 2020-08-07 NOTE — Progress Notes (Signed)
OFFICE NOTE  Chief Complaint:  Routine follow-up  Primary Care Physician: Chrystie Nose, MD  HPI:  Earl Keller is a 79 y.o. male with a history of coronary artery bypass grafting in 2003. In 2012 and non-ST elevation myocardial infarction and catheterization at that time showed patent grafts with small vessel disease and distal disease of native vessels. The ejection fraction that time was 45%. The patient has been doing well on medical therapy with no complaints of angina. He states he is quite active does some hiking and biking fairly regularly without any concerns. He currently denies nausea, vomiting, fever, chest pain, shortness of breath, orthopnea, dizziness, PND, cough, congestion, abdominal pain, hematochezia, melena, lower extremity edema, claudication.  He was previously on Ranexa and isosorbide.   I saw Earl Keller back in the office today. He reports that since January he's been developing a dull aching chest pain. The symptoms came on when walking after about a mile and improved a little bit. Recently he's been having symptoms that are lasting more during a walk and eventually has gotten to a point now where he cannot go on longer walks. He called the office and was restarted on his indoor and continues to have chest pain. He's had 2 episodes of chest discomfort in the past 2 days were taken nitroglycerin which provided some relief. We did note that his nitroglycerin as it is somewhat expired however does burn minimally under his tongue. He reported that initially had improvement in his pain however the symptoms came back after about an hour or so. These are occurring now at rest suggesting that this is more significant unstable angina. EKG in the office shows a stable right bundle branch block with no new ischemic changes.  Earl Keller returns today from his recent hospitalization. He was referred in for unstable angina and underwent heart catheterization. This demonstrated the  following:  Procedure Performed:  1. Left Heart Catheterization 2. Selective Coronary Angiography 3. SVG angiography 4. LIMA graft angiography 5. Left ventricular angiogram 6. PTCA/DES x 1 proximal body of SVG to intermediate branch 7. Angioseal right femoral artery  Operator: [name], MD  Indication: 79 yo male with history of 5V CABG in 2003 with most recent cath September 2012 with 4/5 patent grafts (SVG to Diagonal occluded) but noted to have severe disease in native targets with no options for PCI at that time. Recently having chest pain c/w unstable angina.   Procedure Details: The risks, benefits, complications, treatment options, and expected outcomes were discussed with the patient. The patient and/or family concurred with the proposed plan, giving informed consent. The patient was brought to the cath lab after IV hydration was begun and oral premedication was given. The patient was further sedated with Versed and Fentanyl. The right groin was prepped and draped in the usual manner. Using the modified Seldinger access technique, a 5 French sheath was placed in the right femoral artery. Standard diagnostic catheters were used to perform selective coronary angiography. The JR4 catheter was used to engage the RCA, the LIMA graft and all vein grafts. A pigtail catheter was used to perform a left ventricular angiogram. He was found to have severe stenosis in the ostium of the SVG to the intermediate branch. I elected to proceed to PCI of this vein graft.   PCI Note: He was given 600 mg Plavix po x 1. Angiomax weight based bolus given and drip started. When the ACT was over 200, I engaged the  SVG to the intermediate branch with a 6 French LCB guiding catheter. I then passed a Cougar IC wire down the SVG into the target intermediate branch. I did not use a distal protection device given the short distance of the graft to the target. I also needed to  make guide manipulations with the ostial location of the stenosis and did not want to have frequent drift of the distal protection device. A 2.5 x 12 mm balloon was used to pre-dilate the ostial stenosis x 1. I then deployed a 3.0 x 16 mm Promus Premier DES in the ostium of the SVG. The stent was post-dilated x 1 with a 3.25 x 12 mm [place] balloon x 1. The stenosis was taken from 99% down to 10%. He was given 24 mcg IC adenosine. There was excellent flow into the target vessel. Angioseal placed right femoral artery.   There were no immediate complications. The patient was taken to the recovery area in stable condition.   Hemodynamic Findings: Central aortic pressure: 118/58 Left ventricular pressure: 119/6/20  Angiographic Findings:  Left main: 10% ostial stenosis.   Left Anterior Descending Artery: Moderate caliber vessel that courses to the apex. The proximal vessel has diffuse 60-70% stenosis supplying a small diagonal branch. The mid and distal vessel fills antegrade and from the patent IMA graft.   Circumflex Artery: 100% ostial occlusion. The intermediate branch fills from the patent vein graft. The obtuse marginal branch fills from right to left collaterals (through filling of the SVG to the PDA).   Right Coronary Artery: 100% mid occlusion. The PDA and PLA fill from the patent vein graft. There is severe stenosis in the native PDA retrograde from the graft insertion but this is unchanged from last cath in 2012. This leads to the PLA.   Graft Anatomy:  SVG to Diagonal is occluded SVG sequential to intermediate branch and obtuse marginal is patent to the intermediate but occluded distal limb to the obtuse marginal.  SVG to PDA is patent with 50% anastomosis lesion into the PDA (unchanged from last cath) and as noted above, there is severe disease in the PDA in the more proximal portion of the vessel than where the graft inserts. This supplies the PLA retrograde and is unchanged from last  cath. (Unfavorable target for PCI).  LIMA graft to mid LAD is patent. 40% ostial stenosis IMA, unchanged from last cath.   Left Ventricular Angiogram: LVEF=40% with anterior wall hypokinesis. Mild MR.   Impression: 1. 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  2. 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 Mild to moderate LV systolic dysfunction 4. Successful PTCA/DES x 1 ostium of SVG to intermediate branch.   Recommendations: He will need dual anti-platelet therapy with ASA and Plavix for at least one year.    Complications: None. The patient tolerated the procedure well.  Since his percutaneous intervention to the vein graft to the intermediate branch she is doing much better. He denies any further angina. Unfortunately, his main complaint today is that he has pain in the first MTP joint of the right foot with redness and swelling as well as warmth. This has been going on for couple days and is difficult for him to ambulate. He says he's had some episode like this about a year ago in the left foot. He denies any prior diagnosis of gout. He does carry a history of rheumatoid arthritis, the details of which are not totally clear to  me. I'm not sure if he is followed up by a rheumatologist.  Earl Keller returns today for follow-up. He reports that his gout has resolved. His uric acid level was elevated at 8.0 however he was not back to see his primary care provider for treatment. Unfortunately, an EKG in the office today shows either atypical atrial flutter or coarse atrial fibrillation. This is a new diagnosis for him. I showed him the EKGs and explained atrial fibrillation in great detail including the increased risk for stroke. He seemed to have a difficult time understanding the need to be on anticoagulation. We did discuss the elevated bleeding risk that he may face since he's currently on aspirin and Plavix for a stent that was placed in February.  He seems to be asymptomatic although recently has had some more fatigue which could be attributable to this. He denies any chest pain.   I saw Earl Keller back today in the office. Fortunately he has converted back to sinus rhythm. 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 Unfortunately he did not start the blood thinner. He told me he has a strong family history of bleeding and his father almost bled to death on warfarin. He is also had some GI bleeding and is very concerned about the medication. He understands that his CHADSVASC score is 3, therefore increasing his wrist to about 5% annually for stroke. Despite these warnings he is adamant against taking a blood thinner. He understands the stroke could be physically disabling and even lead to death.  06-Apr-2016  Earl Keller returns today for follow-up. He is without complaints at this time. He continues to decline taking anticoagulation because of concerns. He understands that he is at increased stroke risk with his A. fib. He fortunately has not had recurrent A. fib that was not aware of it immediately. EKG shows stable sinus bradycardia with first-degree AV block. On his heart catheterization 1 year ago it was noted that EF was low at 40-45%. This has not been reassessed since that time.  11/30/2016   Earl Keller returns today for follow-up. He reports she's recently been having some symptoms he's concerned with being angina. He says with exercising on the treadmill he gets some chest discomfort. It tends to get better with rest. He has had to take nitroglycerin 3 times in the last month. He also has some difficulty swallowing. Occasionally this gives him pressure in his chest. He says food sometimes gets stuck when swallowing however he seems to be able to swallow better with hot liquids. He has a remote history of EGD in the past but is never had a documented stricture or dilatation of the esophagus.  12/28/2016  Earl Keller returns today for follow-up. At his last  office visit I made an increase in his nitroglycerin however this is not significantly improved his angina. He still gets chest pain with exertion and particular with exercise. He says over the past several months his continue to limit his ability to exert himself. Based on this his findings are consistent with unstable angina. He has been intolerant to Ranexa in the past. Options seem to be somewhat limited for medical therapy at this point and I am recommending a cardiac catheterization.  04/20/2017  Earl Keller was seen today in follow-up. He is doing much better after his recent stenting in March. He was seen in follow-up by Azalee Course, PA-C. He was doing well at that time. Since then his continue to do well. He exercises without limitations.  He is able to do yard work without any problems. Unfortunately he's had aggressive recurrent to coronary disease. His LDL-C has been <70 on atorvastatin 40 mg daily. I'm wondering if we are missing something on his lipid profile - for example, elevated LP(a) which has shown to predict early graft failure if elevated. He is also enquiring today about reducing the dose of the isosorbide - I think this is reasonable.  10/14/2017  Earl Keller returns today for follow-up.  It has been 6 months since I last saw him.  He continues to be chest pain-free.  He denies any worsening shortness of breath.  He had PCI in March 2018.  He is able to go back to most of his yard work and exercise.  He had a repeat lipid profile recently in June 2018 which showed an LDL-P of 839 and small LDL particle number of 319.  Overall this is a very favorable profile.  Pressure was initially elevated 158/78 however came down to 128/84.  05/18/2018  Seen today in follow-up. No chest pain. Has gained about 5-6 lbs. Reports snacking more at night. Walks on a daily basis. Had a well-controlled lipid profile a year ago. Will need a recheck. BP elevated today - did come down again to 140 systolic .Marland Kitchen He says  it is in the 120's at home.  12/27/2018  Earl Keller is seen today in follow-up.  Overall he is doing well.  He denies any chest pain or shortness of breath.  He says he works daily for about an hour and has no symptoms with that.  It was noted last summer that his triglycerides were elevated 216.  LDL is at goal 64 total cholesterol 141.  Since then there is been about 8 pound weight gain.  Although he exercises, suspect diet could be improved somewhat.  I think this represents some persistent cardiovascular risk.  Blood pressure was elevated today however he said he did not take his medications this morning.  He is fasting.  09/08/2019  Earl Keller returns today for follow-up.  Overall he says he is doing well.  He has no complaints of shortness of breath or worsening fatigue.  He says he is exercising actually a lot more than he had in the past.  Interestingly, however his EKG today shows that he is in A. fib with a slow ventricular response and right bundle branch block.  Heart rate is 47.  Although he has a history of PAF in the past is not had any very recently and therefore has not been on anticoagulation.  With this diagnosis, we discussed at great length today's increased stroke risk.  His CHA2DS2-VASc score is 4, for age, hypertension and coronary disease.  I am recommending starting Eliquis and discontinue his Plavix.  Then we would likely plan to see him back in a month and if he was still in A. fib consider cardioversion.  He however is not in agreement with this.  He notes that in the past many years ago he had a bleeding ulcer for which she required a transfusion.  He is concerned about restarting that.  He also noted that his father had a stroke at similar age and was placed on warfarin and ultimately had significant bleeding with that and died.  Based on that, he is declining anticoagulation at this time.  He understands that he may be at increased risk for stroke which could be disabling or  fatal.  12/07/2019  Earl Keller returns  today for follow-up.  Heart rate is notably lower today at 36 and a slow A. fib.  He is on 12.5 mg of metoprolol twice daily.  I advised him to decrease the dose or go to half of a 25 mg Toprol-XL once a day however he declined.  He says he is felt the best he has felt in years.  I am concerned about absolute bradycardia and explained to him the risks of possible syncope, heart failure or sudden cardiac event related to bradycardia.  In addition we revisited anticoagulation.  His CHADSVASC score is 4.  He is in persistent atrial fibrillation.  His current therapy of aspirin and Plavix or not significant to prevent stroke.  He understands this but does not want to be on anticoagulation.  08/07/2020  Earl Keller is seen today in follow-up.  He is again bradycardic with heart rate of 35 in atrial fibrillation.  He claims to be asymptomatic with this.  We discussed his last time and I recommended stopping his beta-blocker however he wanted to continue to take it.  At this point I will stop the beta-blocker and would not recommend represcribing it.  He is at risk for further development of symptomatic bradycardia.  He also has a right bundle branch block and built-in conduction delay.  He denies any angina or shortness of breath with exertion.  He does say that his heart rate can get up with exercise.  PMHx:  Past Medical History:  Diagnosis Date   CAD (coronary artery disease)    a. 2003: s/p CABG x5V  b. 06/2011: NSTEMI, 4/5 patent grafts (SVG to Diag occluded)- no good PCI targets.  c. 11/2014: [place]: s/p DES to SVG--> intermed    d. 12/2016: [place] s/p DES to SVG--> PDA and PCTA to SVG--> intermed   Complication of anesthesia    "came to before they took the ETT out after OHS"   Dyslipidemia    GERD (gastroesophageal reflux disease)    History of blood transfusion 06/2010   related to "stomach bleeding"   History of gout 11/2014   Hypertension    NSTEMI (non-ST  elevated myocardial infarction) (HCC) 06/2011   Hattie Perch 07/23/2011   PAF (paroxysmal atrial fibrillation) (HCC)    RA (rheumatoid arthritis) (HCC)    "a little in my hands" (01/06/2017)   Trouble swallowing    "comes and goes" (01/06/2017)   Upper GI bleed 07/2010   "went in w/a scope and clipped a bleeding vessel"    Past Surgical History:  Procedure Laterality Date   CARDIAC CATHETERIZATION  2012   showed patent grafts with small- vessel disease and distal disease of the native vessels. There is also some osteal narrowing of his internal mammary artery and the vein graft to the OM and the distal anastomotic site of the vein to the RCA, there was nothing that needed to be intervened upon medically, his EF was still around 45%   CARDIAC CATHETERIZATION  11/30/2014   Procedure: LEFT HEART CATH AND CORS/GRAFTS ANGIOGRAPHY;  Surgeon: Kathleene Hazel, MD;  Location: [facility] CATH LAB;  Service: Cardiovascular;;   CARDIAC CATHETERIZATION  11/30/2014   Procedure: CORONARY STENT INTERVENTION;  Surgeon: Kathleene Hazel, MD;  Location[facility] CATH LAB;  Service: Cardiovascular;;  SVG to RI   CORONARY ANGIOPLASTY WITH STENT PLACEMENT  11/30/2014   PTCA/DES OSLIUM  OF SVG   CORONARY ANGIOPLASTY WITH STENT PLACEMENT  01/06/2017   CORONARY ARTERY BYPASS GRAFT  01/2002   EF 45%; "  before bypass"   CORONARY STENT INTERVENTION N/A 01/06/2017   Procedure: Coronary Stent Intervention;  Surgeon: Kathleene Hazel, MD;  Location: [facility] INVASIVE CV LAB;  Service: Cardiovascular;  Laterality: N/A;   LEFT HEART CATH AND CORS/GRAFTS ANGIOGRAPHY N/A 01/06/2017   Procedure: Left Heart Cath and Cors/Grafts Angiography;  Surgeon: Kathleene Hazel, MD;  Location: [facility] INVASIVE CV LAB;  Service: Cardiovascular;  Laterality: N/A;   TONSILLECTOMY      FAMHx:  Family History  Problem Relation Age of Onset   Sudden death Mother 80   Liver disease Maternal Grandmother    Anuerysm Maternal Grandfather     Stroke Paternal Grandmother    Heart attack Paternal Grandfather     SOCHx:   reports that he has never smoked. He has never used smokeless tobacco. He reports that he does not drink alcohol and does not use drugs.  ALLERGIES:  Allergies  Allergen Reactions   Ranexa [Ranolazine] Other (See Comments)    [place]    ROS: Pertinent items noted in HPI and remainder of comprehensive ROS otherwise negative.  HOME MEDS: Current Outpatient Medications  Medication Sig Dispense Refill   aspirin EC 81 MG tablet Take 81 mg by mouth daily.     atorvastatin (LIPITOR) 40 MG tablet Take 1 tablet by mouth once daily 90 tablet 1   clopidogrel (PLAVIX) 75 MG tablet Take 1 tablet by mouth once daily 90 tablet 0   isosorbide mononitrate (IMDUR) 60 MG 24 hr tablet TAKE 1 TABLET BY MOUTH IN THE MORNING AND 1/2 (ONE-HALF) TABLET IN THE EVENING (Patient taking differently: Take 30 mg by mouth daily. TAKE 1 TABLET BY MOUTH IN THE MORNING AND 1/2 (ONE-HALF) TABLET IN THE EVENING) 135 tablet 0   losartan (COZAAR) 25 MG tablet Take 1 tablet by mouth once daily 90 tablet 0   metoprolol tartrate (LOPRESSOR) 25 MG tablet Take 1/2 (one-half) tablet by mouth twice daily 90 tablet 0   nitroGLYCERIN (NITROSTAT) 0.4 MG SL tablet Place 1 tablet (0.4 mg total) under the tongue every 5 (five) minutes as needed for chest pain. 25 tablet 3   No current facility-administered medications for this visit.    LABS/IMAGING: No results found for this or any previous visit (from the past 48 hour(s)). No results found.  VITALS: BP (!) 144/82 (BP Location: Right Arm, Patient Position: Sitting, Cuff Size: Normal)    Pulse (!) 35    Temp (!) 96.3 F (35.7 C)    Ht  (1.88 m)    Wt 201 lb (91.2 kg)    BMI 25.81 kg/m   EXAM: General appearance: alert and no distress Neck: no carotid bruit, no JVD and thyroid not enlarged, symmetric, no tenderness/mass/nodules Lungs: clear to auscultation bilaterally Heart:  regular rate and rhythm Abdomen: soft, non-tender; bowel sounds normal; no masses,  no organomegaly Extremities: extremities normal, atraumatic, no cyanosis or edema Pulses: 2+ and symmetric Skin: Skin color, texture, turgor normal. No rashes or lesions Neurologic: Grossly normal Psych: Pleasant  EKG: Atrial flutter with variable AV block at 35, RBBB-personally reviewed  ASSESSMENT: 1. CAD s/p PCI to the SVG to RI and SVG to PDA with DES (12/2016) 2. 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 Status post PCI to the SVG to intermediate branch with a Promus Premier DES (11/2014) 5. CAD s/p CABG x 5 in 2003 6. Dyslipidemia 7. Ischemic cardioymyopathy, EF 40-45% (improved to 60-65% in  04/2016) 8. HTN 9. RBBB  PLAN: 1.   Earl Keller continues to have issues with bradycardia and I will discontinue his beta-blocker today.  He is not anticoagulated be on aspirin although he has a CHA2DS2-VASc score of 4.  He has declined this and again I mentioned that he is at increased risk of stroke however he does not want to be anticoagulated beyond aspirin and Plavix.  He technically does not have stent indications to continue dual antiplatelet therapy but because of recurrent coronary disease, lifelong therapy was recommended.  He has decreased his isosorbide significantly after his last stent and currently is taking a half of a 60 mg tablet (30 mg) daily.  Plan follow-up with me in 6 months or sooner as necessary.  Chrystie Nose, MD, [facility], FACP  [place]   [facility]  Medical Director of the [facility]  Card[facility]ovascular [facility] Reduction Clinic Attending Cardiologist  Direct Dial: [phone]   Fax: [phone]  Website:  [email or web address] [email or web address] 08/07/2020, 11:09 AM

## 2020-08-07 NOTE — Patient Instructions (Signed)
Medication Instructions:  Your physician has recommended you make the following change in your medication:  -- STOP metoprolol  -- CONTINUE all other current medications  *If you need a refill on your cardiac medications before your next appointment, please call your pharmacy*   Follow-Up: At [facility], you and your health needs are our priority.  As part of our continuing mission to provide you with exceptional heart care, we have created designated Provider Care Teams.  These Care Teams include your primary Cardiologist (physician) and Advanced Practice Providers (APPs -  Physician Assistants and Nurse Practitioners) who all work together to provide you with the care you need, when you need it.  We recommend signing up for the patient portal called "MyChart".  Sign up information is provided on this After Visit Summary.  MyChart is used to connect with patients for Virtual Visits (Telemedicine).  Patients are able to view lab/test results, encounter notes, upcoming appointments, etc.  Non-urgent messages can be sent to your provider as well.   To learn more about what you can do with MyChart, go to [email or web address].    Your next appointment:   6 month(s)  The format for your next appointment:   In Person  Provider:   You may see Chrystie Nose, MD or one of the following Advanced Practice Providers on your designated Care Team:    Azalee Course, PA-C  Micah Flesher, PA-C or   Judy Pimple, [place]    Other Instructions

## 2020-09-02 ENCOUNTER — Telehealth: Payer: Self-pay | Admitting: *Deleted

## 2020-09-02 NOTE — Telephone Encounter (Signed)
Ok to hold Plavix 5 days prior to procedure, restart after.  Dr H 

## 2020-09-02 NOTE — Telephone Encounter (Signed)
° °  [facility] Pre-operative Risk Assessment    HEARTCARE STAFF: - Please ensure there is not already an duplicate clearance open for this procedure. - Under Visit Info/Reason for Call, type in Other and utilize the format Clearance MM/DD/YY or Clearance TBD. Do not use dashes or single digits. - If request is for dental extraction, please clarify the # of teeth to be extracted.  Request for surgical clearance:  1. What type of surgery is being performed? Bilateral Inguinal Herniorrhaphy with mesh   2. When is this surgery scheduled? TBD(urgent)   3. What type of clearance is required (medical clearance vs. Pharmacy clearance to hold med vs. Both)? Medical  4. Are there any medications that need to be held prior to surgery and how long?Plavix   5. Practice name and name of physician performing surgery? [facility]  6. What is the office phone number? [phone]   7.   What is the office fax number? [phone]  8.   Anesthesia type (None, local, MAC, general) ? unknown   Barbaraann Barthel 09/02/2020, 10:47 AM  _________________________________________________________________   (provider comments below)

## 2020-09-02 NOTE — Telephone Encounter (Signed)
   Primary Cardiologist: Chrystie Nose, MD  Chart reviewed as part of pre-operative protocol coverage.   Patient has a history of CAD s/p CABG in 2003 with subsequent PCI/DES to SVT-intermediate branch in 2016. Additionally he has a history of paroxysmal atrial fibrillation, though continues to decline anticoagulation. Last seen by cardiology at an outpatient visit with Dr. Rennis Golden 07/2020 and was without anginal complaints.  Dr. Rennis Golden - if no change in symptoms, any objections to holding plavix 5 days prior to his upcoming hernia repair? Please route your response back to P CV DIV PREOP.   Thank you!  Beatriz Stallion, PA-C 09/02/2020, 11:50 AM

## 2020-09-03 NOTE — Telephone Encounter (Signed)
   Primary Cardiologist: Chrystie Nose, MD  Chart reviewed as part of pre-operative protocol coverage. Patient was contacted 09/03/2020 in reference to pre-operative risk assessment for pending surgery as outlined below.  NOSSON WENDER was last seen on 08/07/20 by Dr. Rennis Golden.  Since that day, HERB BELTRE has done fine from a cardiac standpoint. He exercises regularly and can easily complete 4 METs without anginal complaints.  Therefore, based on ACC/AHA guidelines, the patient would be at acceptable risk for the planned procedure without further cardiovascular testing.   The patient was advised that if he develops new symptoms prior to surgery to contact our office to arrange for a follow-up visit, and he verbalized understanding.  Per Dr. Rennis Golden, patient can hold plavix 5 days prior to his upcoming hernia surgery and should restart his plavix when cleared to do so by his surgeon.   I will route this recommendation to the requesting party via Epic fax function and remove from pre-op pool. Please call with questions.  Beatriz Stallion, PA-C 09/03/2020, 2:38 PM

## 2020-09-03 NOTE — Telephone Encounter (Signed)
   Primary Cardiologist: Chrystie Nose, MD  Chart reviewed as part of pre-operative protocol coverage.   Left voicemail for patient to call back for ongoing preop evaluation.  Beatriz Stallion, PA-C 09/03/2020, 10:42 AM

## 2020-11-07 ENCOUNTER — Other Ambulatory Visit: Payer: Self-pay | Admitting: Internal Medicine

## 2020-11-28 ENCOUNTER — Other Ambulatory Visit: Payer: Self-pay | Admitting: Internal Medicine

## 2021-02-12 ENCOUNTER — Other Ambulatory Visit: Payer: Self-pay | Admitting: Internal Medicine

## 2021-02-26 ENCOUNTER — Ambulatory Visit: Payer: Medicare PPO | Admitting: Internal Medicine

## 2021-03-14 ENCOUNTER — Other Ambulatory Visit: Payer: Self-pay | Admitting: Internal Medicine

## 2021-03-17 NOTE — Progress Notes (Signed)
Office Visit    Patient Name: Earl Keller Date of Encounter: 03/19/2021  PCP:  Chrystie Nose, MD   [facility]  Cardiologist:  Chrystie Nose, MD  Advanced Practice Provider:  No care team member to display Electrophysiologist:  None   Chief Complaint    Earl Keller is a 80 y.o. male with a hx of CAD s/p CABGx5 2003 with DES to SVG to intermediate branch 2016 and PTCA/DES to SVG-intermediate and SVT-PDA 12/2016 , gout, atrial fibrillation, RBBB, ICM, HTN, HLD  presents today for follow up of CAD and atrial fibrillation.    Past Medical History    Past Medical History:  Diagnosis Date  . CAD (coronary artery disease)    a. 2003: s/p CABG x5V  b. 06/2011: NSTEMI, 4/5 patent grafts (SVG to Diag occluded)- no good PCI targets.  c. 11/2014: [place]: s/p DES to SVG--> intermed    d. 12/2016: [place] s/p DES to SVG--> PDA and PCTA to SVG--> intermed  . Complication of anesthesia    "came to before they took the ETT out after OHS"  . Dyslipidemia   . GERD (gastroesophageal reflux disease)   . History of blood transfusion 06/2010   related to "stomach bleeding"  . History of gout 11/2014  . Hypertension   . NSTEMI (non-ST elevated myocardial infarction) (HCC) 06/2011   Hattie Perch 07/23/2011  . PAF (paroxysmal atrial fibrillation) (HCC)   . RA (rheumatoid arthritis) (HCC)    "a little in my hands" (01/06/2017)  . Trouble swallowing    "comes and goes" (01/06/2017)  . Upper GI bleed 07/2010   "went in w/a scope and clipped a bleeding vessel"   Past Surgical History:  Procedure Laterality Date  . CARDIAC CATHETERIZATION  2012   showed patent grafts with small- vessel disease and distal disease of the native vessels. 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 CARDIAC  CATHETERIZATION  11/30/2014   Procedure: LEFT HEART CATH AND CORS/GRAFTS ANGIOGRAPHY;  Surgeon: Kathleene Hazel, MD;  Location: [facility] CATH LAB;  Service: Cardiovascular;;  . CARDIAC CATHETERIZATION  11/30/2014   Procedure: CORONARY STENT INTERVENTION;  Surgeon: Kathleene Hazel, MD;  Location: [facility] CATH LAB;  Service: Cardiovascular;;  SVG to RI  . CORONARY ANGIOPLASTY WITH STENT PLACEMENT  11/30/2014   PTCA/DES OSLIUM  OF SVG  . CORONARY ANGIOPLASTY WITH STENT PLACEMENT  01/06/2017  . CORONARY ARTERY BYPASS GRAFT  01/2002   EF 45%; "before bypass"  . CORONARY STENT INTERVENTION N/A 01/06/2017   Procedure: Coronary Stent Intervention;  Surgeon: Kathleene Hazel, MD;  Location[facility] IN[facility] CV LAB;  Service: Cardiovascular;  Laterality: N/A;  . LEFT HEART CATH AND CORS/GRAFTS ANGIOGRAPHY N/A 01/06/2017   Procedure: Left Heart Cath and Cors/Grafts Angiography;  Surgeon: Kathleene Hazel, MD;  Location: [facility] INVASIVE CV LAB;  Service: Cardiovascular;  Laterality: N/A;  . TONSILLECTOMY      Allergies  Allergies  Allergen Reactions  . Ranexa [Ranolazine] Other (See Comments)    [place]    History of Present Illness    Earl Keller is a 80 y.o. male with a hx of CAD s/p CABGx5 2003 with DES to SVG to intermediate branch 2016 and PTCA/DES to SVG-intermediate and  SVT-PDA 12/2016 , gout, atrial fibrillation, RBBB, ICM, HTN, HLD. He was last seen 08/07/20 by Dr. Rennis Golden.  History of ICM with lowest reported LVEF 40-45%. Most recent echo 04/2016 with LVEF 60-65%, reduced RVSF, normal IVC, no significant valvular abnormalities. He underwent LHC 12/2016 due to persistent angina showing severe triple vessel coronary disease s/p CABGx5 with 3/5 patent bypass grafts. Noted severe stenosis to distal SVG at anastomosis of PDA, severe stenosis mid body SVG-intermediate and as such, underwent PTCA/DES to those vessels.  He has known atrial fibrillation and has declined anticoagulation due to  family history of bleeding complications despite CHADS2VASc of 5. At most recent clinic visit 08/07/20 he was noted to be bradycardia and beta blocker discontinued.   He presents today for follow up.  Notes a little bit of "angina" if he walks but feels better once his Imdur kicks in. He takes half tablet of Isosorbide in the morning. He goes out for his walk about an hour after his medications. Tells me he will note exertional angina on occasion. [place] walks at least a [place] Reports no shortness of breath nor dyspnea on exertion.  No edema, orthopnea, PND. Reports no palpitations.    EKGs/Labs/Other Studies Reviewed:   The following studies were reviewed today:   EKG:  EKG is ordered today.  The ekg ordered today demonstrates rate controlled atrial fibrillation 50 bpm with known RBBB.  Recent Labs: No results found for requested labs within last 8760 hours.  Recent Lipid Panel    Component Value Date/Time   CHOL 110 12/07/2019 1006   TRIG 67 12/07/2019 1006   HDL 37 (L) 12/07/2019 1006   CHOLHDL 3.0 12/07/2019 1006   CHOLHDL 3.6 03/26/2016 1131   VLDL 29 03/26/2016 1131   LDLCALC 59 12/07/2019 1006    Risk Assessment/Calculations:    CHA2DS2-VASc Score = 5  This indicates a 7.2% annual risk of stroke. The patient's score is based upon: CHF History: Yes HTN History: Yes Diabetes History: No Stroke History: No Vascular Disease History: Yes Age Score: 2 Gender Score: 0   Home Medications   Current Meds  Medication Sig  . aspirin EC 81 MG tablet Take 81 mg by mouth daily.  Marland Kitchen atorvastatin (LIPITOR) 40 MG tablet Take 1 tablet by mouth once daily  . clopidogrel (PLAVIX) 75 MG tablet Take 1 tablet by mouth once daily  . isosorbide mononitrate (IMDUR) 60 MG 24 hr tablet Take 30 mg by mouth daily.  . nitroGLYCERIN (NITROSTAT) 0.4 MG SL tablet Place 1 tablet (0.4 mg total) under the tongue every 5 (five) minutes as needed for chest pain.      Review of Systems  All other systems reviewed and are otherwise negative except as noted above.  Physical Exam    VS:  BP 130/88 (BP Location: Left Arm, Patient Position: Sitting)   Pulse (!) 50   Ht 6\' 2"  (1.88 m)   Wt 200 lb 12.8 oz (91.1 kg)   SpO2 98%   BMI 25.78 kg/m  , BMI Body mass index is 25.78 kg/m.  Wt Readings from Last 3 Encounters:  03/19/21 200 lb 12.8 oz (91.1 kg)  08/07/20 201 lb (91.2 kg)  12/07/19 200 lb 12.8 oz (91.1 kg)     GEN: Well nourished, well developed, in no acute distress. HEENT: normal. Neck: Supple, no JVD, carotid bruits, or masses. Cardiac: irregularly irregular, no murmurs, rubs, or gallops. No clubbing, cyanosis, edema.  Radials/PT 2+ and equal bilaterally.  Respiratory:  Respirations regular and unlabored, clear to auscultation bilaterally. GI: Soft, nontender, nondistended. MS: No deformity or atrophy. Skin: Warm and dry, no rash. Neuro:  Strength and sensation are intact. Psych: Normal affect.  Assessment & Plan    1. CAD s/p CABGx5 and subsequent PCI/DES - EKG today no acute ST/T wave changes. Reports occasional angina if he walks too quickly after medications or after large meal. Anticipate some element of GERD as contributory. Discussed possible increased dose of Imdur but tells me symptoms are infrequent. Continue imdur 30mg  QD and increased to 60mg  QD if symptoms increase in severity. Heart healthy diet and regular cardiovascular exercise encouraged. GDMT includes aspirin, Plavix, Imdur, Atorvastatin, PRN nitroglycerin. Check CBC in setting of DAPT.  2. Persistent atrial fib / RBBB - EKG today shows atrial fibrillation 50bpm. Asypmtomatic related to bradycardia with no lightheadedness, near syncope. Not on AV nodal blocking agents. If symptoms arise, consider ZIO monitor and EP referral for consideration of PPM. Stable RBBB on EKG. Not on anticoagulation due to patient preference despite CHADS2VASc of 5 (agex2, HLD, HTN, CAD).  Verbalizes understanding of stroke risk.   3. HLD, LDL goal <70 - Continue Atorvastatin 40mg  daily. Lipid panel, CMP today.   4. ICM with normalization of LVEF - Euvolemic and well compensated on exam. No edema, orthopnea, shortness of breath. Continue Imdur at present dose.   5. HTN - BP well controlled. Continue current antihypertensive regimen.   Disposition: Follow up in 6 month(s) with Dr. Rennis Golden or APP.   Signed, Alver Sorrow, NP 03/19/2021, 9:04 AM [facility]

## 2021-03-19 ENCOUNTER — Encounter: Payer: Self-pay | Admitting: Family

## 2021-03-19 ENCOUNTER — Ambulatory Visit: Payer: Medicare PPO | Admitting: Family

## 2021-03-19 ENCOUNTER — Other Ambulatory Visit: Payer: Self-pay

## 2021-03-19 VITALS — BP 130/88 | HR 50 | Ht 74.0 in | Wt 200.8 lb

## 2021-03-19 DIAGNOSIS — E785 Hyperlipidemia, unspecified: Secondary | ICD-10-CM | POA: Diagnosis not present

## 2021-03-19 DIAGNOSIS — I4811 Longstanding persistent atrial fibrillation: Secondary | ICD-10-CM

## 2021-03-19 DIAGNOSIS — I255 Ischemic cardiomyopathy: Secondary | ICD-10-CM | POA: Diagnosis not present

## 2021-03-19 DIAGNOSIS — Z951 Presence of aortocoronary bypass graft: Secondary | ICD-10-CM | POA: Diagnosis not present

## 2021-03-19 DIAGNOSIS — Z79899 Other long term (current) drug therapy: Secondary | ICD-10-CM

## 2021-03-19 DIAGNOSIS — I25118 Atherosclerotic heart disease of native coronary artery with other forms of angina pectoris: Secondary | ICD-10-CM | POA: Diagnosis not present

## 2021-03-19 DIAGNOSIS — I4510 Unspecified right bundle-branch block: Secondary | ICD-10-CM

## 2021-03-19 LAB — CBC
Hematocrit: 47 % (ref 37.5–51.0)
Hemoglobin: 15.9 g/dL (ref 13.0–17.7)
MCH: 29.9 pg (ref 26.6–33.0)
MCHC: 33.8 g/dL (ref 31.5–35.7)
MCV: 89 fL (ref 79–97)
Platelets: 189 10*3/uL (ref 150–450)
RBC: 5.31 x10E6/uL (ref 4.14–5.80)
RDW: 12.7 % (ref 11.6–15.4)
WBC: 7.2 10*3/uL (ref 3.4–10.8)

## 2021-03-19 LAB — COMPREHENSIVE METABOLIC PANEL
ALT: 28 IU/L (ref 0–44)
AST: 24 IU/L (ref 0–40)
Albumin/Globulin Ratio: 2.1 (ref 1.2–2.2)
Albumin: 4.4 g/dL (ref 3.7–4.7)
Alkaline Phosphatase: 66 IU/L (ref 44–121)
BUN/Creatinine Ratio: 15 (ref 10–24)
BUN: 17 mg/dL (ref 8–27)
Bilirubin Total: 0.8 mg/dL (ref 0.0–1.2)
CO2: 24 mmol/L (ref 20–29)
Calcium: 9.5 mg/dL (ref 8.6–10.2)
Chloride: 101 mmol/L (ref 96–106)
Creatinine, Ser: 1.15 mg/dL (ref 0.76–1.27)
Globulin, Total: 2.1 g/dL (ref 1.5–4.5)
Glucose: 116 mg/dL — ABNORMAL HIGH (ref 65–99)
Potassium: 5.1 mmol/L (ref 3.5–5.2)
Sodium: 141 mmol/L (ref 134–144)
Total Protein: 6.5 g/dL (ref 6.0–8.5)
eGFR: 66 mL/min/{1.73_m2} (ref >59)

## 2021-03-19 LAB — LIPID PANEL
Chol/HDL Ratio: 3.2 ratio (ref 0.0–5.0)
Cholesterol, Total: 126 mg/dL (ref 100–199)
HDL: 39 mg/dL — ABNORMAL LOW (ref >39)
LDL Chol Calc (NIH): 68 mg/dL (ref 0–99)
Triglycerides: 103 mg/dL (ref 0–149)
VLDL Cholesterol Cal: 19 mg/dL (ref 5–40)

## 2021-03-19 NOTE — Addendum Note (Signed)
Addended by: Lamar Benes on: 03/19/2021 09:21 AM   Modules accepted: Orders

## 2021-03-19 NOTE — Patient Instructions (Addendum)
Medication Instructions:  Continue your current medications.   *If you need a refill on your cardiac medications before your next appointment, please call your pharmacy*  Lab Work: Your physician recommends that you return for lab work today: CMP, lipid panel, CBC  If you have labs (blood work) drawn today and your tests are completely normal, you will receive your results only by: Marland Kitchen MyChart Message (if you have MyChart) OR . A paper copy in the mail If you have any lab test that is abnormal or we need to change your treatment, we will call you to review the results.   Testing/Procedures: Your EKG today showed rate controlled atrial fibrillation. This is stable compared to previous.   Follow-Up: At [facility], you and your health needs are our priority.  As part of our continuing mission to provide you with exceptional heart care, we have created designated Provider Care Teams.  These Care Teams include your primary Cardiologist (physician) and Advanced Practice Providers (APPs -  Physician Assistants and Nurse Practitioners) who all work together to provide you with the care you need, when you need it.  We recommend signing up for the patient portal called "MyChart".  Sign up information is provided on this After Visit Summary.  MyChart is used to connect with patients for Virtual Visits (Telemedicine).  Patients are able to view lab/test results, encounter notes, upcoming appointments, etc.  Non-urgent messages can be sent to your provider as well.   To learn more about what you can do with MyChart, go to [email or web address].    Your next appointment:   6 month(s)  The format for your next appointment:   In Person  Provider:   You may see Chrystie Nose, MD or one of the following Advanced Practice Providers on your designated Care Team:    Azalee Course, PA-C  Micah Flesher, PA-C or   Judy Pimple, [place]  Other Instructions  Heart Healthy Diet Recommendations: A  low-salt diet is recommended. Meats should be grilled, baked, or boiled. Avoid fried foods. Focus on lean protein sources like fish or chicken with vegetables and fruits. The American Heart Association is a Chief Technology Officer!  American Heart Association Diet and Lifeystyle Recommendations   Exercise recommendations: The American Heart Association recommends 150 minutes of moderate intensity exercise weekly. Try 30 minutes of moderate intensity exercise 4-5 times per week. This could include walking, jogging, or swimming.

## 2021-03-20 NOTE — Addendum Note (Signed)
Addended by: Alver Sorrow on: 03/20/2021 03:49 PM   Modules accepted: Orders

## 2021-06-05 ENCOUNTER — Other Ambulatory Visit: Payer: Self-pay | Admitting: Internal Medicine

## 2021-08-20 ENCOUNTER — Other Ambulatory Visit: Payer: Self-pay | Admitting: Internal Medicine

## 2021-11-16 ENCOUNTER — Other Ambulatory Visit: Payer: Self-pay | Admitting: Internal Medicine

## 2021-11-26 NOTE — Progress Notes (Signed)
Cardiology Clinic Note   Patient Name: Earl Keller Date of Encounter: 11/27/2021  Primary Care Provider:  Pixie Casino, MD Primary Cardiologist:  Pixie Casino, MD  Patient Profile    Earl Keller 81 year old male presents the clinic today for follow-up evaluation of his coronary artery disease and ischemic cardiomyopathy  Past Medical History    Past Medical History:  Diagnosis Date   CAD (coronary artery disease)    a. 2003: s/p CABG x5V  b. 06/2011: NSTEMI, 4/5 patent grafts (SVG to Diag occluded)- no good PCI targets.  c. 11/2014: [place]: s/p DES to SVG--> intermed    d. 12/2016: [place] s/p DES to SVG--> PDA and PCTA to SVG--> intermed   Complication of anesthesia    "came to before they took the ETT out after OHS"   Dyslipidemia    GERD (gastroesophageal reflux disease)    History of blood transfusion 06/2010   related to "stomach bleeding"   History of gout 11/2014   Hypertension    NSTEMI (non-ST elevated myocardial infarction) ([place]) 06/2011   Archie Endo 07/23/2011   PAF (paroxysmal atrial fibrillation) ([place])    RA (rheumatoid arthritis) ([place])    "a little in my hands" (01/06/2017)   Trouble swallowing    "comes and goes" (01/06/2017)   Upper GI bleed 07/2010   "went in w/a scope and clipped a bleeding vessel"   Past Surgical History:  Procedure Laterality Date   CARDIAC CATHETERIZATION  2012   showed patent grafts with small- vessel disease and distal disease of the native vessels. There is also some osteal narrowing of his internal mammary artery and the vein graft to the OM and the distal anastomotic site of the vein to the RCA, there was nothing that needed to be intervened upon medically, his EF was still around 45%   CARDIAC CATHETERIZATION  11/30/2014   Procedure: LEFT HEART CATH AND CORS/GRAFTS ANGIOGRAPHY;  Surgeon: Burnell Blanks, MD;  Location: [facility] CATH LAB;  Service: Cardiovascular;;   CARDIAC CATHETERIZATION  11/30/2014   Procedure: CORONARY STENT  INTERVENTION;  Surgeon: Burnell Blanks, MD;  Location: [facility] CATH LAB;  Service: Cardiovascular;;  SVG to RI   CORONARY ANGIOPLASTY WITH STENT PLACEMENT  11/30/2014   PTCA/DES OSLIUM  OF SVG   CORONARY ANGIOPLASTY WITH STENT PLACEMENT  01/06/2017   CORONARY ARTERY BYPASS GRAFT  01/2002   EF 45%; "before bypass"   CORONARY STENT INTERVENTION N/A 01/06/2017   Procedure: Coronary Stent Intervention;  Surgeon: Burnell Blanks, MD;  Location: [facility] CV LAB;  Service: Cardiovascular;  Laterality: N/A;   LEFT HEART CATH AND CORS/GRAFTS ANGIOGRAPHY N/A 01/06/2017   Procedure: Left Heart Cath and Cors/Grafts Angiography;  Surgeon: Burnell Blanks, MD;  Location: [facility] [facility] CV LAB;  Service: Cardiovascular;  Laterality: N/A;   TONSILLECTOMY      Allergies  Allergies  Allergen Reactions   Ranexa [Ranolazine] Other (See Comments)    V[place]    History of Present Illness    [name] has a PMH of coronary artery disease status post CABG x5 in 2003, also with DES to SVG-intermediate branch in 2016 and PT CA/DES to SVG-intermediate and SVG-PDA 3/18, ischemic cardiomyopathy EF 45%, unstable angina, paroxysmal atrial fibrillation, HTN, GERD, HLD, and gout.  He was seen by Dr. Debara Pickett 08/07/2020.  His echocardiogram showed an LVEF of 40-45% and repeat echocardiogram 7/17 showed an LVEF of 60-65% with normal IVC and no significant valvular abnormalities.  He  underwent cardiac catheterization 3/18 due to persistent angina which showed triple-vessel CAD status post CABG x5 with 3/5 patent bypass grafts.  He was noted to have severe stenosis in his distal SVG at anastomosis of PDA, severe stenosis mid SVG-intermediate and underwent PTCA/DES to the vessels.  He has declined anticoagulation due to family history of bleeding complications despite [id number] score of 5.  During his clinic visit 10/21 he was noted to be bradycardic and his beta-blocker was discontinued.  He was seen  by Laurann Montana, NP-C 03/19/2021.  During that time he did note occasional angina but noted feeling better with walking.  He was tolerating center well.  He was taking half tablet of Imdur in the morning, goes for a walk, and takes the rest of his medications after.  He reported occasional exertional angina.  He was walking a mile most days of the week.  He denied shortness of breath and dyspnea with exertion.  He denied lower extremity swelling, orthopnea and PND.  He denied palpitations.  He presents the clinic today for follow-up evaluation states he feels well today.  He continues to be active walking about 2 miles per day.  He does note that when he increases his physical activity he will have some mild chest discomfort.  As soon as he decreases his increased physical activity his chest discomfort goes away.  He reports some dietary indiscretion.  We reviewed the importance of heart healthy low-sodium diet.  His lipid panel from 03/19/2021 will need to be repeated 5/23.  I have advised him that if he is more active in the afternoon he may take an extra half tablet of Imdur.  I will give him the salty 6 diet sheet, order lipid panel 5/23, and plan follow-up for 6 to 9 months.  His blood pressure is elevated in the clinic today at 152/72.  At home his blood pressure is 130s over 60s-70s and he maintains a log.  Today he denies chest pain, shortness of breath, lower extremity edema, fatigue, palpitations, melena, hematuria, hemoptysis, diaphoresis, weakness, presyncope, syncope, orthopnea, and PND.   Home Medications    Prior to Admission medications   Medication Sig Start Date End Date Taking? Authorizing Provider  aspirin EC 81 MG tablet Take 81 mg by mouth daily.    [provider]  atorvastatin (LIPITOR) 40 MG tablet TAKE 1 TABLET BY MOUTH ONCE DAILY . APPOINTMENT REQUIRED FOR FUTURE REFILLS 11/17/21   Pixie Casino, MD  clopidogrel (PLAVIX) 75 MG tablet Take 1 tablet by mouth once  daily 11/17/21   Pixie Casino, MD  isosorbide mononitrate (IMDUR) 60 MG 24 hr tablet TAKE 1 TABLET BY MOUTH IN THE MORNING AND 1/2 (ONE-HALF) IN THE EVENING 11/17/21   Hilty, Nadean Corwin, MD  losartan (COZAAR) 25 MG tablet Take 1 tablet by mouth once daily 11/17/21   Hilty, Nadean Corwin, MD  nitroGLYCERIN (NITROSTAT) 0.4 MG SL tablet Place 1 tablet (0.4 mg total) under the tongue every 5 (five) minutes as needed for chest pain. 11/29/14   Hilty, Nadean Corwin, MD    Family History    Family History  Problem Relation Age of Onset   Sudden death Mother 32   Liver disease Maternal Grandmother    Anuerysm Maternal Grandfather    Stroke Paternal Grandmother    Heart attack Paternal Grandfather    He indicated that his mother is deceased. He indicated that his father is deceased. He indicated that his maternal grandmother is deceased. He  indicated that his maternal grandfather is deceased. He indicated that his paternal grandmother is deceased. He indicated that his paternal grandfather is deceased.  Social History    Social History   Socioeconomic History   Marital status: Married    Spouse name: Not on file   Number of children: Not on file   Years of education: Not on file   Highest education level: Not on file  Occupational History   Not on file  Tobacco Use   Smoking status: Never   Smokeless tobacco: Never  Substance and Sexual Activity   Alcohol use: No   Drug use: No   Sexual activity: Not on file  Other Topics Concern   Not on file  Social History Narrative   Not on file   Social Determinants of Health   Financial Resource Strain: Not on file  Food Insecurity: Not on file  Transportation Needs: Not on file  Physical Activity: Not on file  Stress: Not on file  Social Connections: Not on file  Intimate Partner Violence: Not on file     Review of Systems    General:  No chills, fever, night sweats or weight changes.  Cardiovascular:  No chest pain, dyspnea on exertion,  edema, orthopnea, palpitations, paroxysmal nocturnal dyspnea. Dermatological: No rash, lesions/masses Respiratory: No cough, dyspnea Urologic: No hematuria, dysuria Abdominal:   No nausea, vomiting, diarrhea, bright red blood per rectum, melena, or hematemesis Neurologic:  No visual changes, wkns, changes in mental status. All other systems reviewed and are otherwise negative except as noted above.  Physical Exam    VS:  BP (!) 152/72 (BP Location: Left Arm, Patient Position: Sitting, Cuff Size: Normal)    Pulse (!) 54    Wt 199 lb 3.2 oz (90.4 kg)    SpO2 94%    BMI 25.58 kg/m  , BMI Body mass index is 25.58 kg/m. GEN: Well nourished, well developed, in no acute distress. HEENT: normal. Neck: Supple, no JVD, carotid bruits, or masses. Cardiac: RRR, no murmurs, rubs, or gallops. No clubbing, cyanosis, edema.  Radials/DP/PT 2+ and equal bilaterally.  Respiratory:  Respirations regular and unlabored, clear to auscultation bilaterally. GI: Soft, nontender, nondistended, BS + x 4. MS: no deformity or atrophy. Skin: warm and dry, no rash. Neuro:  Strength and sensation are intact. Psych: Normal affect.  Accessory Clinical Findings    Recent Labs: 03/19/2021: ALT 28; BUN 17; Creatinine, Ser 1.15; Hemoglobin 15.9; Platelets 189; Potassium 5.1; Sodium 141   Recent Lipid Panel    Component Value Date/Time   CHOL 126 03/19/2021 0925   TRIG 103 03/19/2021 0925   HDL 39 (L) 03/19/2021 0925   CHOLHDL 3.2 03/19/2021 0925   CHOLHDL 3.6 03/26/2016 1131   VLDL 29 03/26/2016 1131   LDLCALC 68 03/19/2021 0925    ECG personally reviewed by me today-atrial fibrillation with slow ventricular response with competing junctional pacemaker RBBB 54 bpm- No acute changes  EKG 03/19/2021 Atrial fibrillation 50 bpm with known RBBB  Echocardiogram 04/30/2016 Study Conclusions   - Technical notes: Patient refused definity.  - Procedure narrative: Transthoracic echocardiography for left    ventricular  function evaluation, for right ventricular function    evaluation, and for assessment of valvular function. Image    quality was adequate.  - Left ventricle: The cavity size was normal. Wall thickness was    normal. Systolic function was normal. The estimated ejection    fraction was in the range of 60% to 65%. Wall motion  was normal;    there were no regional wall motion abnormalities. The study is    not technically sufficient to allow evaluation of LV diastolic    function.  - Mitral valve: Sclerotic leaflet tips. Trivial regurgitation.  - Left atrium: The atrium was normal in size.  - Right ventricle: The cavity size was normal. Systolic function is    reduced.  - Right atrium: The atrium was normal in size.  - Tricuspid valve: There was no significant regurgitation.  - Inferior vena cava: The vessel was normal in size. The    respirophasic diameter changes were in the normal range (= 50%),    consistent with normal central venous pressure.   Impressions:   - Technically difficult study. LVEF 60-65%, normal wall thickness,    grossly normal LV wall motion, reduced RV systolic function,    normal LA size, normal IVC.   Assessment & Plan   1.  Coronary artery disease-stable angina.  Notes occasional episodes of angina with daily walks.  Denies chest pain at rest.  Continues to be physically active walking most days per week.  Status post CABG x5 with subsequent PCI and DES. Continue Imdur-May take an extra half dose of Imdur in the afternoon on days when he is more physically active. Continue aspirin, Plavix, atorvastatin, nitroglycerin  Heart healthy low-sodium diet-salty 6 given Increase physical activity as tolerated  Atrial fibrillation/RBBB-heart rate today 54 bpm.denies recent episodes of accelerated heartbeat.  Denies presyncope syncope and falls.beta blocking medication previously discontinued due to bradycardia.  [id number].  Not on anticoagulation due to patient  preference. Continue to monitor Heart healthy low-sodium diet-salty 6 given Increase physical activity as tolerated  Ischemic cardiomyopathy-no increased DOE or activity intolerance.  Euvolemic.  Denies episodes of lower extremity swelling and continues to be physically active. Continue Imdur, atorvastatin, nitroglycerin, losartan Maintain physical activity  Hyperlipidemia-LDL 03/19/2021: Cholesterol, Total 126; HDL 39; LDL Chol Calc (NIH) 68; Triglycerides 103 Continue heart healthy low-sodium high-fiber diet Maintain physical activity Continue atorvastatin, aspirin, Plavix  Essential hypertension-BP today 152/72.  Well-controlled at home. Continue Imdur, losartan Heart healthy low-sodium diet-salty 6 given Increase physical activity as tolerated  Disposition: Follow-up with Dr. Debara Pickett or APP in 6-9 months   Jossie Ng. Janilah Hojnacki NP-C    11/27/2021, [address] [place] [place] [address] Office [phone] Fax [phone]  Notice: This dictation was prepared with Dragon dictation along with smaller phrase technology. Any transcriptional errors that result from this process are unintentional and may not be corrected upon review.  I spent 14 minutes examining this patient, reviewing medications, and using patient centered shared decision making involving her cardiac care.  Prior to her visit I spent greater than 20 minutes reviewing her past medical history,  medications, and prior cardiac tests.

## 2021-11-27 ENCOUNTER — Ambulatory Visit (INDEPENDENT_AMBULATORY_CARE_PROVIDER_SITE_OTHER): Payer: Medicare PPO | Admitting: General Practice

## 2021-11-27 ENCOUNTER — Other Ambulatory Visit: Payer: Self-pay

## 2021-11-27 ENCOUNTER — Encounter: Payer: Self-pay | Admitting: General Practice

## 2021-11-27 VITALS — BP 152/72 | HR 54 | Wt 199.2 lb

## 2021-11-27 DIAGNOSIS — I25118 Atherosclerotic heart disease of native coronary artery with other forms of angina pectoris: Secondary | ICD-10-CM | POA: Diagnosis not present

## 2021-11-27 DIAGNOSIS — I4510 Unspecified right bundle-branch block: Secondary | ICD-10-CM

## 2021-11-27 DIAGNOSIS — I480 Paroxysmal atrial fibrillation: Secondary | ICD-10-CM | POA: Diagnosis not present

## 2021-11-27 DIAGNOSIS — I10 Essential (primary) hypertension: Secondary | ICD-10-CM

## 2021-11-27 DIAGNOSIS — E785 Hyperlipidemia, unspecified: Secondary | ICD-10-CM

## 2021-11-27 DIAGNOSIS — I255 Ischemic cardiomyopathy: Secondary | ICD-10-CM | POA: Diagnosis not present

## 2021-11-27 DIAGNOSIS — Z79899 Other long term (current) drug therapy: Secondary | ICD-10-CM

## 2021-11-27 MED ORDER — ISOSORBIDE MONONITRATE ER 60 MG PO TB24: 30.0000 mg | ORAL_TABLET | Freq: Every day | ORAL | 0 refills | Status: DC

## 2021-11-27 MED ORDER — NITROGLYCERIN 0.4 MG SL SUBL
0.4000 mg | SUBLINGUAL_TABLET | SUBLINGUAL | 3 refills | Status: AC | PRN
Start: 2021-11-27 — End: ?

## 2021-11-27 NOTE — Patient Instructions (Signed)
Medication Instructions:  MAY TAKE EXTRA 1/2 TAB ISOSORBIDE IN THE AFTERNOON  *If you need a refill on your cardiac medications before your next appointment, please call your pharmacy*  Lab Work:    FASTING LIPID AND LFT AFTER MAY      Testing/Procedures:  NONE  Special Instructions PLEASE READ AND FOLLOW SALTY 6-ATTACHED-1,800mg  daily  PLEASE MAINTAIN PHYSICAL ACTIVITY AS TOLERATED   Follow-Up: Your next appointment:  6-9 month(s) In Person with DR HILTY or Edd Fabian, FNP   Please call our office 2 months in advance to schedule this appointment   At [facility], you and your health needs are our priority.  As part of our continuing mission to provide you with exceptional heart care, we have created designated Provider Care Teams.  These Care Teams include your primary Cardiologist (physician) and Advanced Practice Providers (APPs -  Physician Assistants and Nurse Practitioners) who all work together to provide you with the care you need, when you need it.            6 SALTY THINGS TO AVOID     1,800MG  DAILY

## 2021-12-02 NOTE — Addendum Note (Signed)
Addended by: Brunetta GeneraORIE, [place] C on: 12/02/2021 11:13 AM   Modules accepted: Orders

## 2022-02-05 ENCOUNTER — Telehealth: Payer: Self-pay | Admitting: Internal Medicine

## 2022-02-05 NOTE — Telephone Encounter (Signed)
? ?  Pre-operative Risk Assessment  ?  ?Patient Name: Earl Keller  ?DOB: December 08, 1940 ?MRN: [id number]  ? ?  ? ?Request for Surgical Clearance   ? ?Procedure:  EGD with dilation  ? ?Date of Surgery:  Clearance TBD                              ?   ?Surgeon:  Dr. Webb Silversmith ?Surgeon's Group or Practice Name:  [facility] ?Phone number: [phone] ?Fax number:  [phone] ?  ?Type of Clearance Requested:   ?- Medical  ?- Pharmacy:  Hold Clopidogrel (Plavix) 5 days prior ?  ?Type of Anesthesia:  propofol  ?  ?Additional requests/questions:   ? ?Signed, ?Selena Zobro   ?02/05/2022, 4:58 PM   ?

## 2022-02-06 NOTE — Telephone Encounter (Signed)
? ? ?  Name: Earl Keller  ?DOB: [birth year]  ?MRN: [id number] ? ?Primary Cardiologist: Chrystie Nose, MD ? ? ?Preoperative team, please contact this patient and set up a phone call appointment for further preoperative risk assessment. Please obtain consent and complete medication review. Thank you for your help. ? ?Per prior recommendation, Dr. Rennis Golden advised to hold plavix for 5 days before surgery. If no interval change in cardiac health, its reasonable to follow this guidance. ? ? ?Marcelino Duster, PA-C ?02/06/2022, 8:00 AM ?[phone] ?[facility] ?[address] ?[place], [place] [address] ? ? ?

## 2022-02-06 NOTE — Telephone Encounter (Signed)
I s/w the pt and offered a sooner appt to him as he has c/o chest pain. Pt has been rescheduled to a sooner appt to see Micah Flesher, [facility] 02/09/22 @ 2:45. I have cancelled 03/02/22 appt with Edd Fabian, FNP. Pt tells me that when he is out of his Imdur and walks he has episodes if angina. Pt is agreeable to plan of care. Pt tells me that no one even told him that he had an appt on 03/02/22. Pt does also state he would like to be able to see Dr. Rennis Golden sometimes as well, as he states he has not seen Dr. Rennis Golden in a while.  ? ?I will forward notes to requesting provider as well as to [facility] for upcoming appt.  ?

## 2022-02-09 ENCOUNTER — Ambulatory Visit: Payer: Medicare PPO | Admitting: Physician Assistant

## 2022-02-09 ENCOUNTER — Encounter: Payer: Self-pay | Admitting: Physician Assistant

## 2022-02-09 VITALS — BP 140/80 | HR 57 | Ht 74.0 in | Wt 199.6 lb

## 2022-02-09 DIAGNOSIS — I480 Paroxysmal atrial fibrillation: Secondary | ICD-10-CM

## 2022-02-09 DIAGNOSIS — Z01810 Encounter for preprocedural cardiovascular examination: Secondary | ICD-10-CM | POA: Diagnosis not present

## 2022-02-09 DIAGNOSIS — Z01818 Encounter for other preprocedural examination: Secondary | ICD-10-CM | POA: Diagnosis not present

## 2022-02-09 DIAGNOSIS — E785 Hyperlipidemia, unspecified: Secondary | ICD-10-CM

## 2022-02-09 DIAGNOSIS — I10 Essential (primary) hypertension: Secondary | ICD-10-CM

## 2022-02-09 DIAGNOSIS — I25118 Atherosclerotic heart disease of native coronary artery with other forms of angina pectoris: Secondary | ICD-10-CM | POA: Diagnosis not present

## 2022-02-09 DIAGNOSIS — I255 Ischemic cardiomyopathy: Secondary | ICD-10-CM

## 2022-02-09 DIAGNOSIS — Z951 Presence of aortocoronary bypass graft: Secondary | ICD-10-CM

## 2022-02-09 DIAGNOSIS — I495 Sick sinus syndrome: Secondary | ICD-10-CM

## 2022-02-09 NOTE — Patient Instructions (Signed)
Medication Instructions:  ?No Changes ?*If you need a refill on your cardiac medications before your next appointment, please call your pharmacy* ? ? ?Lab Work: ?No labs ?If you have labs (blood work) drawn today and your tests are completely normal, you will receive your results only by: ?MyChart Message (if you have MyChart) OR ?A paper copy in the mail ?If you have any lab test that is abnormal or we need to change your treatment, we will call you to review the results. ? ? ?Testing/Procedures: ?No Testing ? ? ?Follow-Up: ?At [facility], you and your health needs are our priority.  As part of our continuing mission to provide you with exceptional heart care, we have created designated Provider Care Teams.  These Care Teams include your primary Cardiologist (physician) and Advanced Practice Providers (APPs -  Physician Assistants and Nurse Practitioners) who all work together to provide you with the care you need, when you need it. ? ?We recommend signing up for the patient portal called "MyChart".  Sign up information is provided on this After Visit Summary.  MyChart is used to connect with patients for Virtual Visits (Telemedicine).  Patients are able to view lab/test results, encounter notes, upcoming appointments, etc.  Non-urgent messages can be sent to your provider as well.   ?To learn more about what you can do with MyChart, go to [email or web address].   ? ?Your next appointment:   ?4 month(s) ? ?The format for your next appointment:   ?In Person ? ?Provider:   ?Chrystie Nose, MD   ? ? ? ? ?Important Information About Sugar ? ? ? ? ?  ?

## 2022-02-09 NOTE — Progress Notes (Signed)
?Cardiology Office Note:   ? ?Date:  02/09/2022  ? ?ID:  NOX DIGENOVA, DOB [birth year], MRN [id number] ? ?PCP:  Chrystie Nose, MD ?  ?[facility] Providers ?Cardiologist:  Chrystie Nose, MD ?Cardiology APP:  Marcelino Duster, PA { ?Referring MD: Chrystie Nose, MD  ? ?Chief Complaint  ?Patient presents with  ? Pre-op Exam  ? ? ?History of Present Illness:   ? ?Earl Keller is a 81 y.o. male with a hx of CAD s/p CABG x 5 2003 and subsequent PCIs, ischemic cardiomyopathy with LVEF 45%, PAF, HTN, HLD, GERD, and gout. Last heart cath 2018 with 3/5 patent grafts, PTCA/DES to SVG-intermediate and PTCA/DES to SVG-PDA. LVEF 40-45%. Last echocardiogram 04/2016 with LVEF 60-65% with no RWMA and no significant valvular disease. He declines anticoagulation due to family history of bleeding complications. BB has been discontinued due to bradycardia. He walks most days of the week - he takes 1/2 tablet of imdur, goes for a walk, and then takes the rest of his medications when he returns. He was last seen in clinic 11/27/21 and reported mild chest discomfort when increasing physical activity. He was advised he may take an extra 1/2 tablet of imdur in the afternoon on active days.  ? ?We have been asked for preoperative risk assessment for MACE for colonoscopy. Given his ongoing exertional chest pain, I recommended an in-office visit. He presents with his wife. He walks 2-2.5 miles 7 days per week . He is very good at managing PRN imdur. His stable angina is at baseline, no progression of symptoms.  ? ? ? ?Past Medical History:  ?Diagnosis Date  ? CAD (coronary artery disease)   ? a. 2003: s/p CABG x5V  b. 06/2011: NSTEMI, 4/5 patent grafts (SVG to Diag occluded)- no good PCI targets.  c. 11/2014: [place]: s/p DES to SVG--> intermed    d. 12/2016: [place] s/p DES to SVG--> PDA and PCTA to SVG--> intermed  ? Complication of anesthesia   ? "came to before they took the ETT out after OHS"  ? Dyslipidemia   ? GERD (gastroesophageal  reflux disease)   ? History of blood transfusion 06/2010  ? related to "stomach bleeding"  ? History of gout 11/2014  ? Hypertension   ? NSTEMI (non-ST elevated myocardial infarction) (HCC) 06/2011  ? Hattie Perch 07/23/2011  ? PAF (paroxysmal atrial fibrillation) (HCC)   ? RA (rheumatoid arthritis) (HCC)   ? "a little in my hands" (01/06/2017)  ? Trouble swallowing   ? "comes and goes" (01/06/2017)  ? Upper GI bleed 07/2010  ? "went in w/a scope and clipped a bleeding vessel"  ? ? ?Past Surgical History:  ?Procedure Laterality Date  ? CARDIAC CATHETERIZATION  2012  ? showed patent grafts with small- vessel disease and distal disease of the native vessels. There is also some osteal narrowing of his internal mammary artery and the vein graft to the OM and the distal anastomotic site of the vein to the RCA, there was nothing that needed to be intervened upon medically, his EF was still around 45%  ? CARDIAC CATHETERIZATION  11/30/2014  ? Procedure: LEFT HEART CATH AND CORS/GRAFTS ANGIOGRAPHY;  Surgeon: Kathleene Hazel, MD;  Location: [facility] CATH LAB;  Service: Cardiovascular;;  ? CARDIAC CATHETERIZATION  11/30/2014  ? Procedure: CORONARY STENT INTERVENTION;  Surgeon: Kathleene Hazel, MD;  Location: [facility] CATH LAB;  Service: Cardiovascular;;  SVG to RI  ? CORONARY ANGIOPLASTY WITH STENT PLACEMENT  11/30/2014  ?  PTCA/DES OSLIUM  OF SVG  ? CORONARY ANGIOPLASTY WITH STENT PLACEMENT  01/06/2017  ? CORONARY ARTERY BYPASS GRAFT  01/2002  ? EF 45%; "before bypass"  ? CORONARY STENT INTERVENTION N/A 01/06/2017  ? Procedure: Coronary Stent Intervention;  Surgeon: Kathleene Hazel, MD;  Location[facility] INVASIVE CV LAB;  Service: Cardiovascular;  Laterality: N/A;  ? LEFT HEART CATH AND CORS/GRAFTS ANGIOGRAPHY N/A 01/06/2017  ? Procedure: Left Heart Cath and Cors/Grafts Angiography;  Surgeon: Kathleene Hazel, MD;  Location: [facility] INVASIVE CV LAB;  Service: Cardiovascular;  Laterality: N/A;  ? TONSILLECTOMY    ? ? ?Current  Medications: ?Current Meds  ?Medication Sig  ? aspirin EC 81 MG tablet Take 81 mg by mouth daily.  ? atorvastatin (LIPITOR) 40 MG tablet TAKE 1 TABLET BY MOUTH ONCE DAILY . APPOINTMENT REQUIRED FOR FUTURE REFILLS  ? clopidogrel (PLAVIX) 75 MG tablet Take 1 tablet by mouth once daily  ? isosorbide mononitrate (IMDUR) 60 MG 24 hr tablet Take 0.5 tablets (30 mg total) by mouth daily. MAY TAKE EXTRA 1/2 TAB IN THE AFTERNOON  ? losartan (COZAAR) 25 MG tablet Take 1 tablet by mouth once daily  ? nitroGLYCERIN (NITROSTAT) 0.4 MG SL tablet Place 1 tablet (0.4 mg total) under the tongue every 5 (five) minutes as needed for chest pain.  ? pantoprazole (PROTONIX) 40 MG tablet Take 40 mg by mouth daily.  ?  ? ?Allergies:   Ranexa [ranolazine]  ? ?Social History  ? ?Socioeconomic History  ? Marital status: Married  ?  Spouse name: Not on file  ? Number of children: Not on file  ? Years of education: Not on file  ? Highest education level: Not on file  ?Occupational History  ? Not on file  ?Tobacco Use  ? Smoking status: Never  ? Smokeless tobacco: Never  ?Substance and Sexual Activity  ? Alcohol use: No  ? Drug use: No  ? Sexual activity: Not on file  ?Other Topics Concern  ? Not on file  ?Social History Narrative  ? Not on file  ? ?Social Determinants of Health  ? ?Financial Resource Strain: Not on file  ?Food Insecurity: Not on file  ?Transportation Needs: Not on file  ?Physical Activity: Not on file  ?Stress: Not on file  ?Social Connections: Not on file  ?  ? ?Family History: ?The patient's family history includes Anuerysm in his maternal grandfather; Heart attack in his paternal grandfather; Liver disease in his maternal grandmother; Stroke in his paternal grandmother; Sudden death (age of onset: 48) in his mother. ? ?ROS:   ?Please see the history of present illness.    ? All other systems reviewed and are negative. ? ?EKGs/Labs/Other Studies Reviewed:   ? ?The following studies were reviewed today: ? ?[facility] 2018 ?1. Severe  triple vessel CAD s/p 5V CABG with 3/5 patent bypass grafts ?2. Severe stenosis distal body of SVG at anastomosis to the PDA ?3. Severe stenosis mid body of SVG to intermediate branch ?4. Chronic occlusion mid LAD. Distal vessel fills from the patent LIMA graft.  ?5. Chronic occlusion circumflex/intermediate. Intermediate fills from the patent vein graft. As above the vein graft has a severe mid stenosis and moderate ostial stent restenosis.  ?6. Chronic occlusion mid RCA. The PDA and PLA fill from the patent vein graft. There is a severe stenosis at the anastomosis to the PDA. The PLA has a moderate stenosis.  ?7. Successful PTCA/DES x 1 mid body of SVG to intermediate. Successful angioplasty  only ostial stent in the body of the vein graft.  ?8. Successful PTCA/DES x 1 distal body of SVG to PDA.  ?  ?Recommendations: Continue DAPT with ASA and Plavix for at least one year. Continue statin, beta blocker, ARB and Imdur. Home in am if stable.  ? ?EKG:  EKG is  ordered today.  The ekg ordered today demonstrates atrial fibrillation with ventricular rate 57, RBBB ? ?Recent Labs: ?03/19/2021: ALT 28; BUN 17; Creatinine, Ser 1.15; Hemoglobin 15.9; Platelets 189; Potassium 5.1; Sodium 141  ?Recent Lipid Panel ?   ?Component Value Date/Time  ? CHOL 126 03/19/2021 0925  ? TRIG 103 03/19/2021 0925  ? HDL 39 (L) 03/19/2021 0925  ? CHOLHDL 3.2 03/19/2021 0925  ? CHOLHDL 3.6 03/26/2016 1131  ? VLDL 29 03/26/2016 1131  ? LDLCALC 68 03/19/2021 0925  ? ? ? ?Risk Assessment/Calculations:   ? ?CHA2DS2-VASc Score = 5  ? This indicates a 7.2% annual risk of stroke. ?The patient's score is based upon: ?CHF History: 1 ?HTN History: 1 ?Diabetes History: 0 ?Stroke History: 0 ?Vascular Disease History: 1 ?Age Score: 2 ?Gender Score: 0 ?  ? ? ?    ? ?Physical Exam:   ? ?VS:  BP 140/80 (BP Location: Left Arm)   Pulse (!) 57   Ht  (1.88 m)   Wt 199 lb 9.6 oz (90.5 kg)   SpO2 96%   BMI 25.63 kg/m?    ? ?Wt Readings from Last 3  Encounters:  ?02/09/22 199 lb 9.6 oz (90.5 kg)  ?11/27/21 199 lb 3.2 oz (90.4 kg)  ?03/19/21 200 lb 12.8 oz (91.1 kg)  ?  ? ?GEN:  Well nourished, well developed in no acute distress ?HEENT: Normal ?NECK: No JVD; No carotid brui

## 2022-02-17 ENCOUNTER — Other Ambulatory Visit: Payer: Self-pay | Admitting: Internal Medicine

## 2022-03-02 ENCOUNTER — Ambulatory Visit: Payer: Medicare PPO | Admitting: General Practice

## 2022-05-25 ENCOUNTER — Other Ambulatory Visit: Payer: Self-pay | Admitting: Internal Medicine

## 2022-07-23 NOTE — Progress Notes (Signed)
Cardiology Office Note:    Date:  07/27/2022   ID:  ZELIG GACEK, DOB 1941/04/28, MRN [id number]  PCP:  Pixie Casino, MD   [place] Providers Cardiologist:  Pixie Casino, MD Cardiology APP:  Ledora Bottcher, PA     Referring MD: Pixie Casino, MD   Chief Complaint  Patient presents with   Follow-up    ICM    History of Present Illness:    Earl Keller is a 81 y.o. male with a hx of CAD s/p CABG x 5 2003 and subsequent PCIs, ischemic cardiomyopathy with LVEF 45%, PAF, HTN, HLD, GERD, and gout. Last heart cath 2018 with 3/5 patent grafts, PTCA/DES to SVG-intermediate and PTCA/DES to SVG-PDA. LVEF 40-45%. Last echocardiogram 04/2016 with LVEF 60-65% with no RWMA and no significant valvular disease. He declines anticoagulation due to family history of bleeding complications. BB has been discontinued due to bradycardia. He walks most days of the week - he takes 1/2 tablet of imdur, goes for a walk, and then takes the rest of his medications when he returns. He was last seen in clinic 11/27/21 and reported mild chest discomfort when increasing physical activity. He was advised he may take an extra 1/2 tablet of imdur in the afternoon on active days. I saw him in clinic 02/09/22 for preop evaluation prior to colonoscopy after reports of exertional chest pain. He is very active walking 2-2.5 mile 7 days per week and is very good at managing PRN imdur. He had stable angina with no progression of disease. I did not recommend additional ischemic evaluation prior to c-scope.   He presents back today for 5 month follow up. He is in Afib with slow ventricular response. We had a long discussion regarding his stroke risk. He prefers to stay on DAPT for now. BP is well controlled. He does still have exertional angina when he walks in the afternoons. He is taking 1/2 of a tablet, 30 mg, in the morning and sometimes another 1/2 in the afternoons. We discussed increasing this to 60 mg  (whole tab) in the morning or scheduled 30 mg BID. He and his wife, who accompanies him, are under increased stress as their daughter was recently diagnosed with lymphoma.   Past Medical History:  Diagnosis Date   CAD (coronary artery disease)    a. 2003: s/p CABG x5V  b. 06/2011: NSTEMI, 4/5 patent grafts (SVG to Diag occluded)- no good PCI targets.  c. 11/2014: [place]: s/p DES to SVG--> intermed    d. 12/2016: [place] s/p DES to SVG--> PDA and PCTA to SVG--> intermed   Complication of anesthesia    "came to before they took the ETT out after OHS"   Dyslipidemia    GERD (gastroesophageal reflux disease)    History of blood transfusion 06/2010   related to "stomach bleeding"   History of gout 11/2014   Hypertension    NSTEMI (non-ST elevated myocardial infarction) ([place]) 06/2011   Archie Endo 07/23/2011   PAF (paroxysmal atrial fibrillation) ([place])    RA (rheumatoid arthritis) ([place])    "a little in my hands" (01/06/2017)   Trouble swallowing    "comes and goes" (01/06/2017)   Upper GI bleed 07/2010   "went in w/a scope and clipped a bleeding vessel"    Past Surgical History:  Procedure Laterality Date   CARDIAC CATHETERIZATION  2012   showed patent grafts with small- vessel disease and distal disease of the native vessels. There is  also some osteal narrowing of his internal mammary artery and the vein graft to the OM and the distal anastomotic site of the vein to the RCA, there was nothing that needed to be intervened upon medically, his EF was still around 45%   CARDIAC CATHETERIZATION  11/30/2014   Procedure: LEFT HEART CATH AND CORS/GRAFTS ANGIOGRAPHY;  Surgeon: Burnell Blanks, MD;  Location: [facility] CATH LAB;  Service: Cardiovascular;;   CARDIAC CATHETERIZATION  11/30/2014   Procedure: CORONARY STENT INTERVENTION;  Surgeon: Burnell Blanks, MD;  Location: [facility] CATH LAB;  Service: Cardiovascular;;  SVG to RI   CORONARY ANGIOPLASTY WITH STENT PLACEMENT  11/30/2014   PTCA/DES OSLIUM  OF SVG    CORONARY ANGIOPLASTY WITH STENT PLACEMENT  01/06/2017   CORONARY ARTERY BYPASS GRAFT  01/2002   EF 45%; "before bypass"   CORONARY STENT INTERVENTION N/A 01/06/2017   Procedure: Coronary Stent Intervention;  Surgeon: Burnell Blanks, MD;  Location: [place] CV LAB;  Service: Cardiovascular;  Laterality: N/A;   LEFT HEART CATH AND CORS/GRAFTS ANGIOGRAPHY N/A 01/06/2017   Procedure: Left Heart Cath and Cors/Grafts Angiography;  Surgeon: Burnell Blanks, MD;  Location: [place] CV LAB;  Service: Cardiovascular;  Laterality: N/A;   TONSILLECTOMY      Current Medications: Current Meds  Medication Sig   aspirin EC 81 MG tablet Take 81 mg by mouth daily.   atorvastatin (LIPITOR) 40 MG tablet TAKE 1 TABLET BY MOUTH ONCE DAILY . APPOINTMENT REQUIRED FOR FUTURE REFILLS   clopidogrel (PLAVIX) 75 MG tablet Take 1 tablet by mouth once daily   isosorbide mononitrate (IMDUR) 60 MG 24 hr tablet TAKE 1 TABLET BY MOUTH IN THE MORNING AND 1/2 (ONE-HALF) IN THE EVENING   losartan (COZAAR) 25 MG tablet Take 1 tablet by mouth once daily   nitroGLYCERIN (NITROSTAT) 0.4 MG SL tablet Place 1 tablet (0.4 mg total) under the tongue every 5 (five) minutes as needed for chest pain.   pantoprazole (PROTONIX) 40 MG tablet Take 40 mg by mouth daily.     Allergies:   Ranexa [ranolazine]   Social History   Socioeconomic History   Marital status: Married    Spouse name: Not on file   Number of children: Not on file   Years of education: Not on file   Highest education level: Not on file  Occupational History   Not on file  Tobacco Use   Smoking status: Never   Smokeless tobacco: Never  Substance and Sexual Activity   Alcohol use: No   Drug use: No   Sexual activity: Not on file  Other Topics Concern   Not on file  Social History Narrative   Not on file   Social Determinants of Health   Financial Resource Strain: Not on file  Food Insecurity: Not on file  Transportation Needs: Not on  file  Physical Activity: Not on file  Stress: Not on file  Social Connections: Not on file     Family History: The patient's family history includes Anuerysm in his maternal grandfather; Heart attack in his paternal grandfather; Liver disease in his maternal grandmother; Stroke in his paternal grandmother; Sudden death (age of onset: 64) in his mother.  ROS:   Please see the history of present illness.     All other systems reviewed and are negative.  EKGs/Labs/Other Studies Reviewed:    The following studies were reviewed today:  [facility] 2018 1. Severe triple vessel CAD s/p 5V CABG with 3/5 patent bypass  grafts 2. Severe stenosis distal body of SVG at anastomosis to the PDA 3. Severe stenosis mid body of SVG to intermediate branch 4. Chronic occlusion mid LAD. Distal vessel fills from the patent LIMA graft.  5. Chronic occlusion circumflex/intermediate. Intermediate fills from the patent vein graft. As above the vein graft has a severe mid stenosis and moderate ostial stent restenosis.  6. Chronic occlusion mid RCA. The PDA and PLA fill from the patent vein graft. There is a severe stenosis at the anastomosis to the PDA. The PLA has a moderate stenosis.  7. Successful PTCA/DES x 1 mid body of SVG to intermediate. Successful angioplasty only ostial stent in the body of the vein graft.  8. Successful PTCA/DES x 1 distal body of SVG to PDA.    Recommendations: Continue DAPT with ASA and Plavix for at least one year. Continue statin, beta blocker, ARB and Imdur. Home in am if stable.   EKG:  EKG is ordered today.  The ekg ordered today demonstrates atrial fibrillation with VR 50, RBBB  Recent Labs: No results found for requested labs within last 365 days.  Recent Lipid Panel    Component Value Date/Time   CHOL 126 03/19/2021 0925   TRIG 103 03/19/2021 0925   HDL 39 (L) 03/19/2021 0925   CHOLHDL 3.2 03/19/2021 0925   CHOLHDL 3.6 03/26/2016 1131   VLDL 29 03/26/2016 1131   LDLCALC 68  03/19/2021 0925     Risk Assessment/Calculations:                Physical Exam:    VS:  BP 134/74   Ht 6\' 1"  (1.854 m)   Wt 201 lb 9.6 oz (91.4 kg)   SpO2 97%   BMI 26.60 kg/m     Wt Readings from Last 3 Encounters:  07/27/22 201 lb 9.6 oz (91.4 kg)  02/09/22 199 lb 9.6 oz (90.5 kg)  11/27/21 199 lb 3.2 oz (90.4 kg)     GEN:  Well nourished, well developed in no acute distress HEENT: Normal NECK: No JVD; No carotid bruits LYMPHATICS: No lymphadenopathy CARDIAC: irregular rhythm regular rate RESPIRATORY:  Clear to auscultation without rales, wheezing or rhonchi  ABDOMEN: Soft, non-tender, non-distended MUSCULOSKELETAL:  No edema; No deformity  SKIN: Warm and dry NEUROLOGIC:  Alert and oriented x 3 PSYCHIATRIC:  Normal affect   ASSESSMENT:    1. Coronary artery disease involving native coronary artery of native heart without angina pectoris   2. Stable angina   3. Dyslipidemia   4. Hyperlipidemia, unspecified hyperlipidemia type   5. S/P CABG x 5   6. Ischemic cardiomyopathy   7. Chronic systolic heart failure ([place])   8. Essential hypertension   9. PAF (paroxysmal atrial fibrillation) (HCC)    PLAN:    In order of problems listed above:  CAD s/p CABG x 5 and subsequent PCI Hyperlipidemia - continue imdur, ASA, plavix, lipitor, and PRN nitro 03/19/2021: Cholesterol, Total 126; HDL 39; LDL Chol Calc (NIH) 68; Triglycerides 103 - needs updated lipid panel, will draw this today   Ischemic cardiomyopathy Chronic systolic heart failure - EF improved Hypertension - BB stopped due to bradycardia - GDMT now includes imdur and losartan - appears euvolemic   PAF RBBB - was in Afib at last hospital visit - he is not anticoagulation - patient preference This patients CHA2DS2-VASc Score and unadjusted Ischemic Stroke Rate (% per year) is equal to 7.2 % stroke rate/year from a score of 5 (CHF, HTN, CAD,  age2) We discussed his stroke risk and he declines  anticoagulation  Follow up in 5-6 months.   Medication Adjustments/Labs and Tests Ordered: Current medicines are reviewed at length with the patient today.  Concerns regarding medicines are outlined above.  Orders Placed This Encounter  Procedures   Lipid panel   EKG 12-Lead   No orders of the defined types were placed in this encounter.   Patient Instructions  Medication Instructions:  NONE *If you need a refill on your cardiac medications before your next appointment, please call your pharmacy*   Lab Work: Lipid Panel Today If you have labs (blood work) drawn today and your tests are completely normal, you will receive your results only by: [place] (if you have MyChart) OR A paper copy in the mail If you have any lab test that is abnormal or we need to change your treatment, we will call you to review the results.   Follow-Up: At [facility], you and your health needs are our priority.  As part of our continuing mission to provide you with exceptional heart care, we have created designated Provider Care Teams.  These Care Teams include your primary Cardiologist (physician) and Advanced Practice Providers (APPs -  Physician Assistants and Nurse Practitioners) who all work together to provide you with the care you need, when you need it.  We recommend signing up for the patient portal called "MyChart".  Sign up information is provided on this After Visit Summary.  MyChart is used to connect with patients for Virtual Visits (Telemedicine).  Patients are able to view lab/test results, encounter notes, upcoming appointments, etc.  Non-urgent messages can be sent to your provider as well.   To learn more about what you can do with MyChart, go to [email or web address].    Your next appointment:   5 or 6 month(s)  The format for your next appointment:   In Person  Provider:   Pixie Casino, MD      Signed, [place], [place]  07/27/2022 12:35 PM     [place]

## 2022-07-27 ENCOUNTER — Ambulatory Visit: Payer: Medicare PPO | Attending: Physician Assistant | Admitting: Physician Assistant

## 2022-07-27 ENCOUNTER — Encounter: Payer: Self-pay | Admitting: Physician Assistant

## 2022-07-27 VITALS — BP 134/74 | Ht 73.0 in | Wt 201.6 lb

## 2022-07-27 DIAGNOSIS — E785 Hyperlipidemia, unspecified: Secondary | ICD-10-CM

## 2022-07-27 DIAGNOSIS — I255 Ischemic cardiomyopathy: Secondary | ICD-10-CM | POA: Diagnosis not present

## 2022-07-27 DIAGNOSIS — Z951 Presence of aortocoronary bypass graft: Secondary | ICD-10-CM

## 2022-07-27 DIAGNOSIS — I2089 Other forms of angina pectoris: Secondary | ICD-10-CM

## 2022-07-27 DIAGNOSIS — I10 Essential (primary) hypertension: Secondary | ICD-10-CM

## 2022-07-27 DIAGNOSIS — I480 Paroxysmal atrial fibrillation: Secondary | ICD-10-CM

## 2022-07-27 DIAGNOSIS — I2510 Atherosclerotic heart disease of native coronary artery without angina pectoris: Secondary | ICD-10-CM | POA: Diagnosis not present

## 2022-07-27 DIAGNOSIS — I5022 Chronic systolic (congestive) heart failure: Secondary | ICD-10-CM

## 2022-07-27 NOTE — Patient Instructions (Signed)
Medication Instructions:  NONE *If you need a refill on your cardiac medications before your next appointment, please call your pharmacy*   Lab Work: Lipid Panel Today If you have labs (blood work) drawn today and your tests are completely normal, you will receive your results only by: [place] (if you have MyChart) OR A paper copy in the mail If you have any lab test that is abnormal or we need to change your treatment, we will call you to review the results.   Follow-Up: At [facility], you and your health needs are our priority.  As part of our continuing mission to provide you with exceptional heart care, we have created designated Provider Care Teams.  These Care Teams include your primary Cardiologist (physician) and Advanced Practice Providers (APPs -  Physician Assistants and Nurse Practitioners) who all work together to provide you with the care you need, when you need it.  We recommend signing up for the patient portal called "MyChart".  Sign up information is provided on this After Visit Summary.  MyChart is used to connect with patients for Virtual Visits (Telemedicine).  Patients are able to view lab/test results, encounter notes, upcoming appointments, etc.  Non-urgent messages can be sent to your provider as well.   To learn more about what you can do with MyChart, go to [email or web address].    Your next appointment:   5 or 6 month(s)  The format for your next appointment:   In Person  Provider:   Pixie Casino, MD

## 2022-07-28 LAB — LIPID PANEL
Chol/HDL Ratio: 3.2 ratio (ref 0.0–5.0)
Cholesterol, Total: 127 mg/dL (ref 100–199)
HDL: 40 mg/dL (ref >39)
LDL Chol Calc (NIH): 71 mg/dL (ref 0–99)
Triglycerides: 81 mg/dL (ref 0–149)
VLDL Cholesterol Cal: 16 mg/dL (ref 5–40)

## 2022-07-30 ENCOUNTER — Encounter: Payer: Self-pay | Admitting: *Deleted

## 2023-02-15 ENCOUNTER — Ambulatory Visit: Payer: Medicare PPO | Attending: Internal Medicine | Admitting: Internal Medicine

## 2023-02-15 VITALS — BP 122/82 | HR 54 | Ht 73.0 in | Wt 201.2 lb

## 2023-02-15 DIAGNOSIS — I4510 Unspecified right bundle-branch block: Secondary | ICD-10-CM | POA: Diagnosis not present

## 2023-02-15 DIAGNOSIS — I2089 Other forms of angina pectoris: Secondary | ICD-10-CM | POA: Diagnosis not present

## 2023-02-15 DIAGNOSIS — I4811 Longstanding persistent atrial fibrillation: Secondary | ICD-10-CM | POA: Diagnosis not present

## 2023-02-15 DIAGNOSIS — Z951 Presence of aortocoronary bypass graft: Secondary | ICD-10-CM

## 2023-02-15 MED ORDER — APIXABAN 5 MG PO TABS: 5.0000 mg | ORAL_TABLET | Freq: Two times a day (BID) | ORAL | 11 refills | Status: DC

## 2023-02-15 MED ORDER — APIXABAN 5 MG PO TABS: 5.0000 mg | ORAL_TABLET | Freq: Two times a day (BID) | ORAL | 3 refills | Status: DC

## 2023-02-15 NOTE — Addendum Note (Signed)
Addended by: Lindell Spar on: 02/15/2023 08:50 AM   Modules accepted: Orders

## 2023-02-15 NOTE — Progress Notes (Signed)
OFFICE NOTE  Chief Complaint:  Routine follow-up  Primary Care Physician: Chrystie Nose, MD  HPI:  Earl Keller is a 82 y.o. male with a history of coronary artery bypass grafting in 2003. In 2012 and non-ST elevation myocardial infarction and catheterization at that time showed patent grafts with small vessel disease and distal disease of native vessels. The ejection fraction that time was 45%. The patient has been doing well on medical therapy with no complaints of angina. He states he is quite active does some hiking and biking fairly regularly without any concerns. He currently denies nausea, vomiting, fever, chest pain, shortness of breath, orthopnea, dizziness, PND, cough, congestion, abdominal pain, hematochezia, melena, lower extremity edema, claudication.  He was previously on Ranexa and isosorbide.   I saw Earl Keller back in the office today. He reports that since January he's been developing a dull aching chest pain. The symptoms came on when walking after about a mile and improved a little bit. Recently he's been having symptoms that are lasting more during a walk and eventually has gotten to a point now where he cannot go on longer walks. He called the office and was restarted on his indoor and continues to have chest pain. He's had 2 episodes of chest discomfort in the past 2 days were taken nitroglycerin which provided some relief. We did note that his nitroglycerin as it is somewhat expired however does burn minimally under his tongue. He reported that initially had improvement in his pain however the symptoms came back after about an hour or so. These are occurring now at rest suggesting that this is more significant unstable angina. EKG in the office shows a stable right bundle branch block with no new ischemic changes.  Earl Keller returns today from his recent hospitalization. He was referred in for unstable angina and underwent heart catheterization. This demonstrated the  following:  Procedure Performed:   Left Heart Catheterization Selective Coronary Angiography SVG angiography LIMA graft angiography Left ventricular angiogram PTCA/DES x 1 proximal body of SVG to intermediate branch Angioseal right femoral artery  Operator: [name], MD  Indication: 82 yo male with history of 5V CABG in 2003 with most recent cath September 2012 with 4/5 patent grafts (SVG to Diagonal occluded) but noted to have severe disease in native targets with no options for PCI at that time. Recently having chest pain c/w unstable angina.                                   Procedure Details: The risks, benefits, complications, treatment options, and expected outcomes were discussed with the patient. The patient and/or family concurred with the proposed plan, giving informed consent. The patient was brought to the cath lab after IV hydration was begun and oral premedication was given. The patient was further sedated with Versed and Fentanyl. The right groin was prepped and draped in the usual manner. Using the modified Seldinger access technique, a 5 French sheath was placed in the right femoral artery. Standard diagnostic catheters were used to perform selective coronary angiography. The JR4 catheter was used to engage the RCA, the LIMA graft and all vein grafts. A pigtail catheter was used to perform a left ventricular angiogram. He was found to have severe stenosis in the ostium of the SVG to the intermediate branch. I elected to proceed to PCI of this vein graft.   PCI Note:  He was given 600 mg Plavix po x 1. Angiomax weight based bolus given and drip started. When the ACT was over 200, I engaged the SVG to the intermediate branch with a 6 French LCB guiding catheter. I then passed a Cougar IC wire down the SVG into the target intermediate branch. I did not use a distal protection device given the short distance of the graft to the target. I also needed to make guide  manipulations with the ostial location of the stenosis and did not want to have frequent drift of the distal protection device. A 2.5 x 12 mm balloon was used to pre-dilate the ostial stenosis x 1. I then deployed a 3.0 x 16 mm Promus Premier DES in the ostium of the SVG. The stent was post-dilated x 1 with a 3.25 x 12 mm [place] balloon x 1. The stenosis was taken from 99% down to 10%. He was given 24 mcg IC adenosine. There was excellent flow into the target vessel. Angioseal placed right femoral artery.   There were no immediate complications. The patient was taken to the recovery area in stable condition.   Hemodynamic Findings: Central aortic pressure: 118/58 Left ventricular pressure: 119/6/20  Angiographic Findings:  Left main: 10% ostial stenosis.    Left Anterior Descending Artery: Moderate caliber vessel that courses to the apex. The proximal vessel has diffuse 60-70% stenosis supplying a small diagonal branch. The mid and distal vessel fills antegrade and from the patent IMA graft.    Circumflex Artery: 100% ostial occlusion. The intermediate branch fills from the patent vein graft. The obtuse marginal branch fills from right to left collaterals (through filling of the SVG to the PDA).     Right Coronary Artery: 100% mid occlusion. The PDA and PLA fill from the patent vein graft. There is severe stenosis in the native PDA retrograde from the graft insertion but this is unchanged from last cath in 2012. This leads to the PLA.   Graft Anatomy:  SVG to Diagonal is occluded SVG sequential to intermediate branch and obtuse marginal is patent to the intermediate but occluded distal limb to the obtuse marginal.   SVG to PDA is patent with 50% anastomosis lesion into the PDA (unchanged from last cath) and as noted above, there is severe disease in the PDA in the more proximal portion of the vessel than where the graft inserts. This supplies the PLA retrograde and is unchanged from last cath.  (Unfavorable target for PCI).   LIMA graft to mid LAD is patent. 40% ostial stenosis IMA, unchanged from last cath.   Left Ventricular Angiogram: LVEF=40% with anterior wall hypokinesis. Mild MR.   Impression: 1. 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   2. 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 Mild to moderate LV systolic dysfunction 4. Successful PTCA/DES x 1 ostium of SVG to intermediate branch.    Recommendations: He will need dual anti-platelet therapy with ASA and Plavix for at least one year.          Complications:  None. The patient tolerated the procedure well.     Since his percutaneous intervention to the vein graft to the intermediate branch she is doing much better. He denies any further angina. Unfortunately, his main complaint today is that he has pain in the first MTP joint of the right foot with redness and swelling as well as warmth. This has been going on for couple days and is difficult  for him to ambulate. He says he's had some episode like this about a year ago in the left foot. He denies any prior diagnosis of gout. He does carry a history of rheumatoid arthritis, the details of which are not totally clear to me. I'm not sure if he is followed up by a rheumatologist.  Earl Keller returns today for follow-up. He reports that his gout has resolved. His uric acid level was elevated at 8.0 however he was not back to see his primary care provider for treatment. Unfortunately, an EKG in the office today shows either atypical atrial flutter or coarse atrial fibrillation. This is a new diagnosis for him. I showed him the EKGs and explained atrial fibrillation in great detail including the increased risk for stroke. He seemed to have a difficult time understanding the need to be on anticoagulation. We did discuss the elevated bleeding risk that he may face since he's currently on aspirin and Plavix for a stent that was placed in February. He  seems to be asymptomatic although recently has had some more fatigue which could be attributable to this. He denies any chest pain.   I saw Earl Keller back today in the office. Fortunately he has converted back to sinus rhythm. 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 Unfortunately he did not start the blood thinner. He told me he has a strong family history of bleeding and his father almost bled to death on warfarin. He is also had some GI bleeding and is very concerned about the medication. He understands that his CHADSVASC score is 3, therefore increasing his wrist to about 5% annually for stroke. Despite these warnings he is adamant against taking a blood thinner. He understands the stroke could be physically disabling and even lead to death.  03/29/2016  Earl Keller returns today for follow-up. He is without complaints at this time. He continues to decline taking anticoagulation because of concerns. He understands that he is at increased stroke risk with his A. fib. He fortunately has not had recurrent A. fib that was not aware of it immediately. EKG shows stable sinus bradycardia with first-degree AV block. On his heart catheterization 1 year ago it was noted that EF was low at 40-45%. This has not been reassessed since that time.  11/30/2016   Earl Keller returns today for follow-up. He reports she's recently been having some symptoms he's concerned with being angina. He says with exercising on the treadmill he gets some chest discomfort. It tends to get better with rest. He has had to take nitroglycerin 3 times in the last month. He also has some difficulty swallowing. Occasionally this gives him pressure in his chest. He says food sometimes gets stuck when swallowing however he seems to be able to swallow better with hot liquids. He has a remote history of EGD in the past but is never had a documented stricture or dilatation of the esophagus.  12/28/2016  Earl Keller returns today for follow-up. At his last  office visit I made an increase in his nitroglycerin however this is not significantly improved his angina. He still gets chest pain with exertion and particular with exercise. He says over the past several months his continue to limit his ability to exert himself. Based on this his findings are consistent with unstable angina. He has been intolerant to Ranexa in the past. Options seem to be somewhat limited for medical therapy at this point and I am recommending a cardiac catheterization.  04/20/2017  Mr.  Keller was seen today in follow-up. He is doing much better after his recent stenting in March. He was seen in follow-up by Azalee Course, PA-C. He was doing well at that time. Since then his continue to do well. He exercises without limitations. He is able to do yard work without any problems. Unfortunately he's had aggressive recurrent to coronary disease. His LDL-C has been <70 on atorvastatin 40 mg daily. I'm wondering if we are missing something on his lipid profile - for example, elevated LP(a) which has shown to predict early graft failure if elevated. He is also enquiring today about reducing the dose of the isosorbide - I think this is reasonable.  10/14/2017  Earl Keller returns today for follow-up.  It has been 6 months since I last saw him.  He continues to be chest pain-free.  He denies any worsening shortness of breath.  He had PCI in March 2018.  He is able to go back to most of his yard work and exercise.  He had a repeat lipid profile recently in June 2018 which showed an LDL-P of 839 and small LDL particle number of 319.  Overall this is a very favorable profile.  Pressure was initially elevated 158/78 however came down to 128/84.  05/18/2018  Seen today in follow-up. No chest pain. Has gained about 5-6 lbs. Reports snacking more at night. Walks on a daily basis. Had a well-controlled lipid profile a year ago. Will need a recheck. BP elevated today - did come down again to 140 systolic .Marland Kitchen He says  it is in the 120's at home.  12/27/2018  Earl Keller is seen today in follow-up.  Overall he is doing well.  He denies any chest pain or shortness of breath.  He says he works daily for about an hour and has no symptoms with that.  It was noted last summer that his triglycerides were elevated 216.  LDL is at goal 64 total cholesterol 141.  Since then there is been about 8 pound weight gain.  Although he exercises, suspect diet could be improved somewhat.  I think this represents some persistent cardiovascular risk.  Blood pressure was elevated today however he said he did not take his medications this morning.  He is fasting.  09/08/2019  Earl Keller returns today for follow-up.  Overall he says he is doing well.  He has no complaints of shortness of breath or worsening fatigue.  He says he is exercising actually a lot more than he had in the past.  Interestingly, however his EKG today shows that he is in A. fib with a slow ventricular response and right bundle branch block.  Heart rate is 47.  Although he has a history of PAF in the past is not had any very recently and therefore has not been on anticoagulation.  With this diagnosis, we discussed at great length today's increased stroke risk.  His CHA2DS2-VASc score is 4, for age, hypertension and coronary disease.  I am recommending starting Eliquis and discontinue his Plavix.  Then we would likely plan to see him back in a month and if he was still in A. fib consider cardioversion.  He however is not in agreement with this.  He notes that in the past many years ago he had a bleeding ulcer for which she required a transfusion.  He is concerned about restarting that.  He also noted that his father had a stroke at similar age and was placed on warfarin and  ultimately had significant bleeding with that and died.  Based on that, he is declining anticoagulation at this time.  He understands that he may be at increased risk for stroke which could be disabling or  fatal.  12/07/2019  Earl Keller returns today for follow-up.  Heart rate is notably lower today at 36 and a slow A. fib.  He is on 12.5 mg of metoprolol twice daily.  I advised him to decrease the dose or go to half of a 25 mg Toprol-XL once a day however he declined.  He says he is felt the best he has felt in years.  I am concerned about absolute bradycardia and explained to him the risks of possible syncope, heart failure or sudden cardiac event related to bradycardia.  In addition we revisited anticoagulation.  His CHADSVASC score is 4.  He is in persistent atrial fibrillation.  His current therapy of aspirin and Plavix or not significant to prevent stroke.  He understands this but does not want to be on anticoagulation.  08/07/2020  Earl Keller is seen today in follow-up.  He is again bradycardic with heart rate of 35 in atrial fibrillation.  He claims to be asymptomatic with this.  We discussed his last time and I recommended stopping his beta-blocker however he wanted to continue to take it.  At this point I will stop the beta-blocker and would not recommend represcribing it.  He is at risk for further development of symptomatic bradycardia.  He also has a right bundle branch block and built-in conduction delay.  He denies any angina or shortness of breath with exertion.  He does say that his heart rate can get up with exercise.  02/15/2023  Earl Keller is again seen in follow-up.  His EKG again is suggestive of atrial fibrillation.  This has been seen in the past however he was not interested in anticoagulation.  We discussed that further today including the significance of stroke risk with a CHA2DS2-VASc score now of 5.  He understands his increased risk and would be agreeable to starting Eliquis.  He does report some stable angina symptoms however seem to be controlled with isosorbide.  PMHx:  Past Medical History:  Diagnosis Date   CAD (coronary artery disease)    a. 2003: s/p CABG x5V  b. 06/2011:  NSTEMI, 4/5 patent grafts (SVG to Diag occluded)- no good PCI targets.  c. 11/2014: [place]: s/p DES to SVG--> intermed    d. 12/2016: [place] s/p DES to SVG--> PDA and PCTA to SVG--> intermed   Complication of anesthesia    "came to before they took the ETT out after OHS"   Dyslipidemia    GERD (gastroesophageal reflux disease)    History of blood transfusion 06/2010   related to "stomach bleeding"   History of gout 11/2014   Hypertension    NSTEMI (non-ST elevated myocardial infarction) (HCC) 06/2011   Hattie Perch 07/23/2011   PAF (paroxysmal atrial fibrillation) (HCC)    RA (rheumatoid arthritis) (HCC)    "a little in my hands" (01/06/2017)   Trouble swallowing    "comes and goes" (01/06/2017)   Upper GI bleed 07/2010   "went in w/a scope and clipped a bleeding vessel"    Past Surgical History:  Procedure Laterality Date   CARDIAC CATHETERIZATION  2012   showed patent grafts with small- vessel disease and distal disease of the native vessels. There is also some osteal narrowing of his internal mammary artery and the vein graft to  the OM and the distal anastomotic site of the vein to the RCA, there was nothing that needed to be intervened upon medically, his EF was still around 45%   CARDIAC CATHETERIZATION  11/30/2014   Procedure: LEFT HEART CATH AND CORS/GRAFTS ANGIOGRAPHY;  Surgeon: Kathleene Hazel, MD;  Location[facility] CATH LAB;  Service: Cardiovascular;;   CARDIAC CATHETERIZATION  11/30/2014   Procedure: CORONARY STENT INTERVENTION;  Surgeon: Kathleene Hazel, MD;  Location: [facility] CATH LAB;  Service: Cardiovascular;;  SVG to RI   CORONARY ANGIOPLASTY WITH STENT PLACEMENT  11/30/2014   PTCA/DES OSLIUM  OF SVG   CORONARY ANGIOPLASTY WITH STENT PLACEMENT  01/06/2017   CORONARY ARTERY BYPASS GRAFT  01/2002   EF 45%; "before bypass"   CORONARY STENT INTERVENTION N/A 01/06/2017   Procedure: Coronary Stent Intervention;  Surgeon: Kathleene Hazel, MD;  Location: [facility] INVASIVE CV LAB;  Service:  Cardiovascular;  Laterality: N/A;   LEFT HEART CATH AND CORS/GRAFTS ANGIOGRAPHY N/A 01/06/2017   Procedure: Left Heart Cath and Cors/Grafts Angiography;  Surgeon: Kathleene Hazel, MD;  Location: [facility] INVASIVE CV LAB;  Service: Cardiovascular;  Laterality: N/A;   TONSILLECTOMY      FAMHx:  Family History  Problem Relation Age of Onset   Sudden death Mother 25   Liver disease Maternal Grandmother    Anuerysm Maternal Grandfather    Stroke Paternal Grandmother    Heart attack Paternal Grandfather     SOCHx:   reports that he has never smoked. He has never used smokeless tobacco. He reports that he does not drink alcohol and does not use drugs.  ALLERGIES:  Allergies  Allergen Reactions   Ranexa [Ranolazine] Other (See Comments)    [place]    ROS: Pertinent items noted in HPI and remainder of comprehensive ROS otherwise negative.  HOME MEDS: Current Outpatient Medications  Medication Sig Dispense Refill   aspirin EC 81 MG tablet Take 81 mg by mouth daily.     atorvastatin (LIPITOR) 40 MG tablet TAKE 1 TABLET BY MOUTH ONCE DAILY . APPOINTMENT REQUIRED FOR FUTURE REFILLS 90 tablet 3   clopidogrel (PLAVIX) 75 MG tablet Take 1 tablet by mouth once daily 90 tablet 3   isosorbide mononitrate (IMDUR) 60 MG 24 hr tablet TAKE 1 TABLET BY MOUTH IN THE MORNING AND 1/2 (ONE-HALF) IN THE EVENING 135 tablet 0   losartan (COZAAR) 25 MG tablet Take 1 tablet by mouth once daily 90 tablet 3   nitroGLYCERIN (NITROSTAT) 0.4 MG SL tablet Place 1 tablet (0.4 mg total) under the tongue every 5 (five) minutes as needed for chest pain. 25 tablet 3   pantoprazole (PROTONIX) 40 MG tablet Take 40 mg by mouth daily.     No current facility-administered medications for this visit.    LABS/IMAGING: No results found for this or any previous visit (from the past 48 hour(s)). No results found.  VITALS: BP 122/82 (BP Location: Left Arm, Patient Position: Sitting, Cuff Size: Normal)   Pulse (!) 54    Ht 6\' 1"  (1.854 m)   Wt 201 lb 3.2 oz (91.3 kg)   SpO2 98%   BMI 26.55 kg/m   EXAM: General appearance: alert and no distress Neck: no carotid bruit, no JVD and thyroid not enlarged, symmetric, no tenderness/mass/nodules Lungs: clear to auscultation bilaterally Heart: regular rate and rhythm Abdomen: soft, non-tender; bowel sounds normal; no masses,  no organomegaly Extremities: extremities normal, atraumatic, no cyanosis or edema Pulses: 2+ and symmetric Skin: Skin color, texture,  turgor normal. No rashes or lesions Neurologic: Grossly normal Psych: Pleasant  EKG: A-fib/flutter with right bundle branch block at 54-personally reviewed  ASSESSMENT: CAD s/p PCI to the SVG to RI and SVG to PDA with DES (12/2016) Paroxysmal atrial fibrillation/flutter - CHADSVASC score 5 Podagra - uric acid level of 8.0 Status post PCI to the SVG to intermediate branch with a Promus Premier DES (11/2014) CAD s/p CABG x 5 in 2003 Dyslipidemia Ischemic cardioymyopathy, EF 40-45% (improved to 60-65% in 04/2016) HTN RBBB  PLAN: 1.   Earl Keller again has what appears to be in A-fib or flutter with heart rate of 54 and right bundle branch block.  He is asymptomatic with this.  We discussed anticoagulation again because of his aging and other comorbidities his CHA2DS2-VASc score is now 5.  After further discussion with him and his wife he is now agreeable to going on Eliquis and will plan to stop aspirin and clopidogrel.  Will continue with half dose isosorbide with an option to take the additional half tablet if he has more angina.  Plan follow-up with me in 6 months or sooner as necessary.  Chrystie Nose, MD, [facility], FACP  [place]  [facility]  Medical Director of the Advanced Lipid Disorders &  Cardiovascular Risk Reduction Clinic Attending Cardiologist  Direct Dial: [phone]  Fax: [phone]  Website:  [email or web address] Nicely Allani Reber 02/15/2023, 8:16 AM

## 2023-02-15 NOTE — Patient Instructions (Signed)
Medication Instructions:  STOP aspirin STOP clopidogrel   START eliquis  twice daily -- every 12 hours  *If you need a refill on your cardiac medications before your next appointment, please call your pharmacy*   Follow-Up: At [facility], you and your health needs are our priority.  As part of our continuing mission to provide you with exceptional heart care, we have created designated Provider Care Teams.  These Care Teams include your primary Cardiologist (physician) and Advanced Practice Providers (APPs -  Physician Assistants and Nurse Practitioners) who all work together to provide you with the care you need, when you need it.  We recommend signing up for the patient portal called "MyChart".  Sign up information is provided on this After Visit Summary.  MyChart is used to connect with patients for Virtual Visits (Telemedicine).  Patients are able to view lab/test results, encounter notes, upcoming appointments, etc.  Non-urgent messages can be sent to your provider as well.   To learn more about what you can do with MyChart, go to [email or web address].    Your next appointment:    6 months with Dr. Rennis Golden -- call mid-May/early June for an appointment

## 2023-05-21 ENCOUNTER — Other Ambulatory Visit: Payer: Self-pay | Admitting: Internal Medicine

## 2023-06-15 ENCOUNTER — Other Ambulatory Visit: Payer: Self-pay | Admitting: Internal Medicine

## 2023-06-16 ENCOUNTER — Other Ambulatory Visit: Payer: Self-pay

## 2023-06-16 ENCOUNTER — Other Ambulatory Visit: Payer: Self-pay | Admitting: Internal Medicine

## 2023-06-16 MED ORDER — ATORVASTATIN CALCIUM 40 MG PO TABS: 40.0000 mg | ORAL_TABLET | Freq: Every day | ORAL | 2 refills | Status: DC

## 2023-09-02 ENCOUNTER — Telehealth: Payer: Self-pay

## 2023-09-02 NOTE — Telephone Encounter (Signed)
...     Pre-operative Risk Assessment    Patient Name: [name]  DOB: 1941-10-21 MRN: [id number]      Request for Surgical Clearance    Procedure:   RETINAL SURGERY  Date of Surgery:  Clearance 10/01/23                                 Surgeon:  DR ERIC [facility] Surgeon's Group or Practice Name:  [facility] Phone number:  [phone] Fax number:  [phone]   Type of Clearance Requested:   - Medical  - Pharmacy:  Hold Apixaban (Eliquis)     Type of Anesthesia:  MAC   Additional requests/questions:   LAST O/V 02/15/23, NO RETURN APPT  Signed, Renee Ramus   09/02/2023, 2:41 PM

## 2023-09-02 NOTE — Telephone Encounter (Addendum)
Called pts PCP and they stated that patient has not had recent labs with them. Called patient to schedule labs but no answer. VM was full so I could not leave a message

## 2023-09-02 NOTE — Telephone Encounter (Signed)
Callback team please contact patients PCP to see if a CBC has been completed over the past year.  I looked within his chart and was not able to locate a current.  We can go ahead and order a CBC if needed to complete his workup for holding Eliquis prior to his retinal procedure.

## 2023-09-06 NOTE — Telephone Encounter (Signed)
Will d/w pre op APP if the pt will need tele appt beside lab appt. If so I will handle all in one telephone call.

## 2023-09-06 NOTE — Telephone Encounter (Signed)
   Name: Earl Keller  DOB: 1940-11-25  MRN: [id number]  Primary Cardiologist: Chrystie Nose, MD   Preoperative team, please contact this patient and set up a phone call appointment for further preoperative risk assessment. Please obtain consent and complete medication review. Thank you for your help.  I confirm that guidance regarding antiplatelet and oral anticoagulation therapy has been completed and, if necessary, noted below.  Will need pharm D recommendations after CBC.   I also confirmed the patient resides in the state of [place]. As per [facility] Medical Board telemedicine laws, the patient must reside in the state in which the provider is licensed.   Carlos Levering, NP 09/06/2023, 10:33 AM [facility]

## 2023-09-06 NOTE — Telephone Encounter (Signed)
VM box is full, could not leave message to call back. Pt will need to set up lab appt as well as tele pre op appt a few days after labs have been done.

## 2023-09-07 NOTE — Telephone Encounter (Signed)
2nd attempt; Tried to reach the pt to set up tele appt and labs, vm full cannot leave message to call back.

## 2023-09-09 NOTE — Telephone Encounter (Signed)
I have tried to reach the pt x 3 to schedule lab work needed for pre op clearance as well as a tele appt a few days after the labs are done. I will update the requesting office of this.   The 910 # recording states number not in service. The other # on file for the pt vm is full.

## 2023-10-01 NOTE — Telephone Encounter (Signed)
Pt was contacted at least three times, no answer and no ability to leave VM. Pt does not have an active MyChart.   I will remove from preop box for now. Should he still need surgery and eliquis hold, will need an updated CBC.

## 2023-12-29 ENCOUNTER — Telehealth: Payer: Self-pay | Admitting: *Deleted

## 2023-12-29 NOTE — Telephone Encounter (Signed)
 Tried calling patient to schedule in office appt no answer left a detailed message to call back and schedule in person office appt for upcoming procedure

## 2023-12-29 NOTE — Telephone Encounter (Signed)
   Name: Earl Keller  DOB: August 02, 1941  MRN: [id number]  Primary Cardiologist: Chrystie Nose, MD  Chart reviewed as part of pre-operative protocol coverage. Because of Earl Keller's past medical history and time since last visit, he will require a follow-up in-office visit in order to better assess preoperative cardiovascular risk.  Patient will also need updated CBC prior to guidance being given for managing Eliquis  Pre-op covering staff: - Please schedule appointment and call patient to inform them. If patient already had an upcoming appointment within acceptable timeframe, please add "pre-op clearance" to the appointment notes so provider is aware. - Please contact requesting surgeon's office via preferred method (i.e, phone, fax) to inform them of need for appointment prior to surgery.   Napoleon Form, Leodis Rains, NP  12/29/2023, 1:31 PM

## 2023-12-29 NOTE — Telephone Encounter (Signed)
   Pre-operative Risk Assessment    Patient Name: Earl Keller  DOB: January 02, 1941 MRN: [id number]   Date of last office visit: 02/15/23 DR. HILTY Date of next office visit: NONE  Request for Surgical Clearance    Procedure:  Dental Extraction - Amount of Teeth to be Pulled:  23 TEETH SURGICALLY EXTRACTED   Date of Surgery:  Clearance TBD                                Surgeon:  NOT LISTED Surgeon's Group or Practice Name:  [facility]  Phone number:  [phone] Fax number:  [phone]   Type of Clearance Requested:   - Medical  - Pharmacy:  Hold Apixaban (Eliquis)     Type of Anesthesia:  Local    Additional requests/questions:    Elpidio Anis   12/29/2023, 1:25 PM

## 2023-12-31 NOTE — Telephone Encounter (Signed)
 Patient is returning call.

## 2023-12-31 NOTE — Telephone Encounter (Signed)
 Pt agreeable to see Robin Searing, NP 01/06/24 @ [address] 8:50.

## 2023-12-31 NOTE — Telephone Encounter (Signed)
 Left message for patient to call back to make an in office visit.

## 2024-01-04 NOTE — Progress Notes (Unsigned)
 Cardiology Office Note    Patient Name: Earl Keller Date of Encounter: 01/06/2024  Primary Care Provider:  Olive Bass, MD Primary Cardiologist:  Chrystie Nose, MD Primary Electrophysiologist: None   Past Medical History    Past Medical History:  Diagnosis Date   CAD (coronary artery disease)    a. 2003: s/p CABG x5V  b. 06/2011: NSTEMI, 4/5 patent grafts (SVG to Diag occluded)- no good PCI targets.  c. 11/2014: [place]: s/p DES to SVG--> intermed    d. 12/2016: [place] s/p DES to SVG--> PDA and PCTA to SVG--> intermed   Complication of anesthesia    "came to before they took the ETT out after OHS"   Dyslipidemia    GERD (gastroesophageal reflux disease)    History of blood transfusion 06/2010   related to "stomach bleeding"   History of gout 11/2014   Hypertension    NSTEMI (non-ST elevated myocardial infarction) (HCC) 06/2011   Earl Keller 07/23/2011   PAF (paroxysmal atrial fibrillation) (HCC)    RA (rheumatoid arthritis) (HCC)    "a little in my hands" (01/06/2017)   Trouble swallowing    "comes and goes" (01/06/2017)   Upper GI bleed 07/2010   "went in w/a scope and clipped a bleeding vessel"    History of Present Illness  Earl Keller is a 83 y.o. 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  Earl Keller has a past medical history of CAD with CABG x 5 in 2003 and subsequent LHC completed in 2018 with DES to SVG to IM and to SVG to PDA.  He was previously on beta-blocker but was discontinued due to bradycardia.  Patient's last 2D echo was completed in 2017 showing EF of 60 to 65% with no RWMA and trivial MVR.  He was last seen by Dr. Rennis Golden on 02/15/2023 was in AF with controlled rate and was started on Eliquis due to elevated CHA2DS2-VASc score 5.  He had aspirin and Plavix discontinued at that time.  He previously declined AC due to  history of bleeding complications.  He denied any angina and was tolerating Imdur without any complications.  Earl Keller  presents for a routine check-up and surgical clearance. The patient's blood pressure was slightly elevated during the visit at 160/82 and was 158/78 on recheck. He attributes this to the stress of driving into town today and walking around prior to today's. The patient's atrial flutter is well-controlled on Eliquis, and he reports no symptoms related to this condition. The patient's angina is mostly controlled with isosorbide (Imdur), but he reports occasional flare-ups during strenuous activity.  He reports improvement with rest and additional 30 mg of Imdur.  He is scheduled to have dental surgery soon, involving the extraction of 23 teeth. The patient also reports occasional itching, particularly in the back and legs, which is worse during cold weather. The patient manages this with an anti-itch lotion, which he reports is effective.  Was advised to follow-up with his PCP if itching persist.  He palpitations, dyspnea, PND, orthopnea, nausea, vomiting, dizziness, syncope, edema, weight gain, or early satiety.  Review of Systems  Please see the history of present illness.    All other systems reviewed and are otherwise negative except as noted above.  Physical Exam    Wt Readings  from Last 3 Encounters:  01/06/24 203 lb 6.4 oz (92.3 kg)  02/15/23 201 lb 3.2 oz (91.3 kg)  07/27/22 201 lb 9.6 oz (91.4 kg)   VS: Vitals:   01/06/24 0842 01/06/24 0949  BP: (!) 160/82 (!) 158/78  Pulse: (!) 59   SpO2: 96%   ,Body mass index is 26.84 kg/m. GEN: Well nourished, well developed in no acute distress Neck: No JVD; No carotid bruits Pulmonary: Clear to auscultation without rales, wheezing or rhonchi  Cardiovascular: Irregularly irregular normal S1. Normal S2.   Murmurs: There is no murmur.  ABDOMEN: Soft, non-tender, non-distended EXTREMITIES:  No edema; No deformity   EKG/LABS/  Recent Cardiac Studies   ECG personally reviewed by me today -atrial flutter with variable AV block rate of 59 bpm with RBBB changes consistent with previous EKG.  Risk Assessment/Calculations:    CHA2DS2-VASc Score = 4   This indicates a 4.8% annual risk of stroke. The patient's score is based upon: CHF History: 0 HTN History: 1 Diabetes History: 0 Stroke History: 0 Vascular Disease History: 1 Age Score: 2 Gender Score: 0         Lab Results  Component Value Date   WBC 7.2 03/19/2021   HGB 15.9 03/19/2021   HCT 47.0 03/19/2021   MCV 89 03/19/2021   PLT 189 03/19/2021   Lab Results  Component Value Date   CREATININE 1.15 03/19/2021   BUN 17 03/19/2021   NA 141 03/19/2021   K 5.1 03/19/2021   CL 101 03/19/2021   CO2 24 03/19/2021   Lab Results  Component Value Date   CHOL 127 07/27/2022   HDL 40 07/27/2022   LDLCALC 71 07/27/2022   TRIG 81 07/27/2022   CHOLHDL 3.2 07/27/2022    Lab Results  Component Value Date   HGBA1C 5.6 07/20/2011   Assessment & Plan    1.  Preoperative clearance: -Patient's RCRI score is 6.6% The patient affirms he has been doing well without any new cardiac symptoms. They are able to achieve 7 METS without cardiac limitations. Therefore, based on ACC/AHA guidelines, the patient would be at acceptable risk for the planned procedure without further cardiovascular testing. The patient was advised that if he develops new symptoms prior to surgery to contact our office to arrange for a follow-up visit, and he verbalized understanding.   -Per office protocol, patient can hold Eliquis for 2 days prior to procedure.   Patient will not need bridging with Lovenox (enoxaparin) around procedure.  2.  History of CAD: -s/p CABG x 5 in 2003 and LHC 2018 with DES to SVG and to SVG to PDA, -Today patient reports compliance with Imdur and notes occasional episodes of breakthrough angina that are resolved with additional as needed 30 mg of  Imdur. -Continue current GDMT with Lipitor 40 mg daily and Imdur 30 mg daily with additional 30 mg as needed -Patient advised to use as needed Nitrostat if chest pain is not relieved with as needed  3.  History of ICM: -Patient's last 2D echo was improved at 60-65%  -Patient is euvolemic on exam with no reports of shortness of breath -Continue Imdur as noted above and losartan 25 mg  4.  Primary hypertension: -HYPERTENSION CONTROL Vitals:   01/06/24 0842 01/06/24 0949  BP: (!) 160/82 (!) 158/78    The patient's blood pressure is elevated above target today.  In order to address the patient's elevated BP: Blood pressure will be monitored at home to determine if medication  changes need to be made.    - Patient was advised to monitor blood pressures over the next week and if still elevated may increase losartan to 50 mg daily -Continue losartan 25 mg daily  5.  Hyperlipidemia: -Patient's last LDL cholesterol was completed in 07/2022 and was 71 -Patient will have fasting LFTs and lipids checked at next follow-up visit.  6.  Paroxysmal AF: -Patient is rate controlled at 59 with no reports of tachycardia noted since previous follow-up -Patient needs CBC to receive guidance on holding Eliquis prior to dental extraction -Patient's last creatinine was 1.24 -CHA2DS2-VASc Score = 4 [CHF History: 0, HTN History: 1, Diabetes History: 0, Stroke History: 0, Vascular Disease History: 1, Age Score: 2, Gender Score: 0].  Therefore, the patient's annual risk of stroke is 4.8 %.      Disposition: Follow-up with Chrystie Nose, MD or APP in 12 months    Signed, Napoleon Form, Leodis Rains, NP 01/06/2024, 9:55 AM [facility]

## 2024-01-06 ENCOUNTER — Encounter: Payer: Self-pay | Admitting: Nurse Practitioner

## 2024-01-06 ENCOUNTER — Ambulatory Visit: Attending: Nurse Practitioner | Admitting: Nurse Practitioner

## 2024-01-06 VITALS — BP 158/78 | HR 59 | Ht 73.0 in | Wt 203.4 lb

## 2024-01-06 DIAGNOSIS — I2510 Atherosclerotic heart disease of native coronary artery without angina pectoris: Secondary | ICD-10-CM

## 2024-01-06 DIAGNOSIS — Z01810 Encounter for preprocedural cardiovascular examination: Secondary | ICD-10-CM

## 2024-01-06 DIAGNOSIS — Z951 Presence of aortocoronary bypass graft: Secondary | ICD-10-CM | POA: Diagnosis not present

## 2024-01-06 DIAGNOSIS — I255 Ischemic cardiomyopathy: Secondary | ICD-10-CM

## 2024-01-06 DIAGNOSIS — E785 Hyperlipidemia, unspecified: Secondary | ICD-10-CM | POA: Diagnosis not present

## 2024-01-06 DIAGNOSIS — I480 Paroxysmal atrial fibrillation: Secondary | ICD-10-CM

## 2024-01-06 DIAGNOSIS — I10 Essential (primary) hypertension: Secondary | ICD-10-CM

## 2024-01-06 NOTE — Patient Instructions (Signed)
 Medication Instructions:  Your physician recommends that you continue on your current medications as directed. Please refer to the Current Medication list given to you today. *If you need a refill on your cardiac medications before your next appointment, please call your pharmacy*   Lab Work: [facility] If you have labs (blood work) drawn today and your tests are completely normal, you will receive your results only by: MyChart Message (if you have MyChart) OR A paper copy in the mail If you have any lab test that is abnormal or we need to change your treatment, we will call you to review the results.   Testing/Procedures: None Ordered   Follow-Up: At [facility], you and your health needs are our priority.  As part of our continuing mission to provide you with exceptional heart care, we have created designated Provider Care Teams.  These Care Teams include your primary Cardiologist (physician) and Advanced Practice Providers (APPs -  Physician Assistants and Nurse Practitioners) who all work together to provide you with the care you need, when you need it.  We recommend signing up for the patient portal called "MyChart".  Sign up information is provided on this After Visit Summary.  MyChart is used to connect with patients for Virtual Visits (Telemedicine).  Patients are able to view lab/test results, encounter notes, upcoming appointments, etc.  Non-urgent messages can be sent to your provider as well.   To learn more about what you can do with MyChart, go to [email or web address].    Your next appointment:   12 month(s)  Provider:   Chrystie Nose, MD     Other Instructions  Check your blood pressure daily for 1 week, then contact the office with your readings.  Contact the office either by phone or MyChart with your readings.  Make sure to check your blood pressure 2 hours after taking your medications.   AVOID these things for 30 minutes before checking your blood  pressure: No Drinking caffeine. No Drinking alcohol. No Eating. No Smoking. No Exercising.  Five minutes before checking your blood pressure: Pee. Sit in a dining chair. Avoid sitting in a soft couch or armchair. Be quiet. Do not talk.

## 2024-01-07 LAB — CBC
Hematocrit: 47.3 % (ref 37.5–51.0)
Hemoglobin: 15.2 g/dL (ref 13.0–17.7)
MCH: 28.6 pg (ref 26.6–33.0)
MCHC: 32.1 g/dL (ref 31.5–35.7)
MCV: 89 fL (ref 79–97)
Platelets: 199 10*3/uL (ref 150–450)
RBC: 5.31 x10E6/uL (ref 4.14–5.80)
RDW: 13 % (ref 11.6–15.4)
WBC: 7.2 10*3/uL (ref 3.4–10.8)

## 2024-01-07 NOTE — Telephone Encounter (Signed)
 Patient notified of pharmacist recommendations. Patient agreeable and voiced understanding.   Patient's wife wanted to know if we could give the patient a letter stating he is having dental work to see if it would help with the patients dental bill due to the patient not having dental insurance.   I spoke with the pharmacist who states we can not do that and we do not have any control of dental work needed unless we feel it is affecting the patients health.   Patient voiced understanding and states his wife was just trying to explore all options since he does not have dental coverage and the bill is expensive.

## 2024-01-07 NOTE — Telephone Encounter (Signed)
 Patient with diagnosis of atrial fibrillation on Eliquis for anticoagulation.    Procedure:  Dental Extraction - Amount of Teeth to be Pulled:  23 TEETH SURGICALLY EXTRACTED    Date of Surgery:  Clearance TBD       CHA2DS2-VASc Score = 4   This indicates a 4.8% annual risk of stroke. The patient's score is based upon: CHF History: 0 HTN History: 1 Diabetes History: 0 Stroke History: 0 Vascular Disease History: 1 Age Score: 2 Gender Score: 0    CrCl 61 Platelet count 199  Patient does not require pre-op antibiotics for dental procedure.  Per office protocol, patient can hold Eliquis for 2 days prior to procedure.   Patient will not need bridging with Lovenox (enoxaparin) around procedure.  **This guidance is not considered finalized until pre-operative APP has relayed final recommendations.**

## 2024-01-07 NOTE — Telephone Encounter (Signed)
 Pharmacy please advise on holding Eliquis prior to dental extraction of 23 teeth scheduled for TBD. Thank you.  Please forward back to Earl Searing, NP.

## 2024-01-07 NOTE — Telephone Encounter (Signed)
 Gaston Islam., NP  You4 hours ago (12:26 PM)    Please contact Mr. Let him know to hold his Eliquis 2 days prior to his upcoming dental extraction.  Please let us know if you have any further questions.  Robin Searing, NP   Joylene Grapes, NP  Gaston Islam., NP6 hours ago (10:08 AM)    See pharmD recs. Thanks! -EM

## 2024-01-26 ENCOUNTER — Telehealth: Payer: Self-pay | Admitting: Internal Medicine

## 2024-01-26 NOTE — Telephone Encounter (Signed)
  The patient had extractions on Monday. Today, he was seen to check the wound. The wound is healing well, but Dr. Elaina Pattee and the patient are concerned that bleeding might occur if he resumes taking a blood thinner. They would like to know if the patient can continue holding Eliquis and start taking it on Friday, 01/28/24

## 2024-02-01 NOTE — Telephone Encounter (Signed)
 Patient identification verified by 2 forms. Shade Flood, RN    Tried calling patient. No answer. Left detailed vm with pharmacy response regarding restarting eliquis.

## 2024-02-02 NOTE — Telephone Encounter (Signed)
 Spoke with patient about Eliquis. He resumed Eliquis Friday 4/4. No further assistance needed

## 2024-02-02 NOTE — Telephone Encounter (Signed)
 Jonah Blue, RN  [place], [place] [phone] hours ago (8:13 AM)   Cheree Ditto, [facility]  Jonah Blue, RN; Chrystie Nose, MD7 days ago   Yes, please restart Friday morning   Jonah Blue, RN  [facility]; [place], Lisette Abu, MD7 days ago   Please advise if patient can hold eliquis until Friday 01/28/24? Thank you

## 2024-02-09 ENCOUNTER — Other Ambulatory Visit: Payer: Self-pay | Admitting: Internal Medicine

## 2024-03-22 ENCOUNTER — Other Ambulatory Visit: Payer: Self-pay | Admitting: Internal Medicine

## 2024-03-23 ENCOUNTER — Other Ambulatory Visit: Payer: Self-pay

## 2024-03-23 MED ORDER — ATORVASTATIN CALCIUM 40 MG PO TABS: 40.0000 mg | ORAL_TABLET | Freq: Every day | ORAL | 3 refills | Status: AC

## 2024-04-03 ENCOUNTER — Other Ambulatory Visit: Payer: Self-pay | Admitting: Internal Medicine

## 2024-04-03 NOTE — Telephone Encounter (Signed)
 Prescription refill request for Eliquis  received. Indication: AF Last office visit: 01/06/24  Sharlene Dawn NP Scr: 1.24 on 07/07/23  Epic Age: 83 Weight: 92.3kg  Based on above findings Eliquis  5mg  twice daily is the appropriate dose.  Refill approved.

## 2024-04-11 ENCOUNTER — Other Ambulatory Visit: Payer: Self-pay | Admitting: Internal Medicine

## 2024-10-02 ENCOUNTER — Other Ambulatory Visit: Payer: Self-pay | Admitting: Internal Medicine

## 2024-10-02 DIAGNOSIS — I480 Paroxysmal atrial fibrillation: Secondary | ICD-10-CM

## 2024-10-02 NOTE — Telephone Encounter (Signed)
 Spoke with patient and advised lab work overdue. Order placed, patient will have drawn

## 2024-10-02 NOTE — Telephone Encounter (Signed)
 Prescription refill request for Eliquis  received. Indication: AF Last office visit: 01/06/24  FORBES Alberts NP Scr: 1.24 on 07/07/23  Epic Age: 83 Weight: 92.3kg  Based on above findings Eliquis  5mg  twice daily is the appropriate dose. Requested BMP be checked at upcoming appt with MD.  Refill approved.

## 2024-10-13 LAB — LAB REPORT - SCANNED: EGFR: 65

## 2024-10-16 ENCOUNTER — Ambulatory Visit: Payer: Self-pay | Admitting: Internal Medicine
